# Patient Record
Sex: Female | Born: 1980 | Race: Black or African American | Hispanic: No | Marital: Married | Smoking: Current some day smoker
Health system: Southern US, Community
[De-identification: ages and names within clinical notes are randomized; demographics above are authoritative.]

## PROBLEM LIST (undated history)

## (undated) ENCOUNTER — Inpatient Hospital Stay (HOSPITAL_COMMUNITY): Payer: Self-pay

## (undated) DIAGNOSIS — J40 Bronchitis, not specified as acute or chronic: Secondary | ICD-10-CM

## (undated) DIAGNOSIS — J45909 Unspecified asthma, uncomplicated: Secondary | ICD-10-CM

## (undated) HISTORY — PX: NO PAST SURGERIES: SHX2092

---
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
## 2003-04-19 ENCOUNTER — Emergency Department (HOSPITAL_COMMUNITY): Admission: EM | Admit: 2003-04-19 | Discharge: 2003-04-19 | Payer: Self-pay | Admitting: Emergency Medicine

## 2003-09-22 ENCOUNTER — Emergency Department (HOSPITAL_COMMUNITY): Admission: EM | Admit: 2003-09-22 | Discharge: 2003-09-23 | Payer: Self-pay | Admitting: Emergency Medicine

## 2004-05-30 ENCOUNTER — Inpatient Hospital Stay (HOSPITAL_COMMUNITY): Admission: AD | Admit: 2004-05-30 | Discharge: 2004-05-30 | Payer: Self-pay | Admitting: *Deleted

## 2004-06-01 ENCOUNTER — Emergency Department (HOSPITAL_COMMUNITY): Admission: EM | Admit: 2004-06-01 | Discharge: 2004-06-01 | Payer: Self-pay | Admitting: Emergency Medicine

## 2004-09-29 ENCOUNTER — Emergency Department (HOSPITAL_COMMUNITY): Admission: EM | Admit: 2004-09-29 | Discharge: 2004-09-29 | Payer: Self-pay | Admitting: Emergency Medicine

## 2005-05-21 ENCOUNTER — Emergency Department (HOSPITAL_COMMUNITY): Admission: EM | Admit: 2005-05-21 | Discharge: 2005-05-22 | Payer: Self-pay | Admitting: Emergency Medicine

## 2006-07-29 ENCOUNTER — Inpatient Hospital Stay (HOSPITAL_COMMUNITY): Admission: AD | Admit: 2006-07-29 | Discharge: 2006-07-29 | Payer: Self-pay | Admitting: Obstetrics & Gynecology

## 2008-12-08 ENCOUNTER — Inpatient Hospital Stay (HOSPITAL_COMMUNITY): Admission: AD | Admit: 2008-12-08 | Discharge: 2008-12-08 | Payer: Self-pay | Admitting: Obstetrics & Gynecology

## 2009-12-06 ENCOUNTER — Inpatient Hospital Stay (HOSPITAL_COMMUNITY): Admission: AD | Admit: 2009-12-06 | Discharge: 2009-12-06 | Payer: Self-pay | Admitting: Family Medicine

## 2009-12-29 ENCOUNTER — Emergency Department (HOSPITAL_COMMUNITY)
Admission: EM | Admit: 2009-12-29 | Discharge: 2009-12-29 | Payer: Self-pay | Source: Home / Self Care | Admitting: Family Medicine

## 2010-01-01 ENCOUNTER — Emergency Department (HOSPITAL_COMMUNITY)
Admission: EM | Admit: 2010-01-01 | Discharge: 2010-01-02 | Payer: Self-pay | Source: Home / Self Care | Admitting: Emergency Medicine

## 2010-04-06 LAB — POCT PREGNANCY, URINE: Preg Test, Ur: NEGATIVE

## 2010-04-06 LAB — URINALYSIS, ROUTINE W REFLEX MICROSCOPIC
Glucose, UA: NEGATIVE mg/dL
Glucose, UA: NEGATIVE mg/dL
Hgb urine dipstick: NEGATIVE
Specific Gravity, Urine: 1.044 — ABNORMAL HIGH (ref 1.005–1.030)
Specific Gravity, Urine: >1.03 — ABNORMAL HIGH (ref 1.005–1.030)
Urobilinogen, UA: 0.2 mg/dL (ref 0.0–1.0)
pH: 6.5 (ref 5.0–8.0)

## 2010-04-06 LAB — WET PREP, GENITAL
Clue Cells Wet Prep HPF POC: NONE SEEN
Trich, Wet Prep: NONE SEEN

## 2010-04-06 LAB — GC/CHLAMYDIA PROBE AMP, GENITAL: GC Probe Amp, Genital: NEGATIVE

## 2010-04-06 LAB — URINE MICROSCOPIC-ADD ON

## 2010-04-06 LAB — CBC
MCV: 91.6 fL (ref 78.0–100.0)
Platelets: 276 10*3/uL (ref 150–400)
RBC: 4.29 MIL/uL (ref 3.87–5.11)
WBC: 7 10*3/uL (ref 4.0–10.5)

## 2010-04-06 LAB — POCT I-STAT, CHEM 8
Calcium, Ion: 1.12 mmol/L (ref 1.12–1.32)
Chloride: 101 mEq/L (ref 96–112)
Creatinine, Ser: 0.9 mg/dL (ref 0.4–1.2)
Glucose, Bld: 90 mg/dL (ref 70–99)
HCT: 44 % (ref 36.0–46.0)
Potassium: 3.9 mEq/L (ref 3.5–5.1)
TCO2: 27 mmol/L (ref 0–100)

## 2010-04-28 LAB — CBC
HCT: 41.6 % (ref 36.0–46.0)
Hemoglobin: 14 g/dL (ref 12.0–15.0)
RBC: 4.53 MIL/uL (ref 3.87–5.11)
WBC: 7.3 10*3/uL (ref 4.0–10.5)

## 2010-04-28 LAB — POCT PREGNANCY, URINE: Preg Test, Ur: POSITIVE

## 2010-04-28 LAB — GC/CHLAMYDIA PROBE AMP, GENITAL: Chlamydia, DNA Probe: NEGATIVE

## 2010-04-28 LAB — URINE MICROSCOPIC-ADD ON

## 2010-04-28 LAB — URINALYSIS, ROUTINE W REFLEX MICROSCOPIC
Glucose, UA: NEGATIVE mg/dL
Leukocytes, UA: NEGATIVE
Nitrite: NEGATIVE
Specific Gravity, Urine: 1.015 (ref 1.005–1.030)
pH: 7 (ref 5.0–8.0)

## 2010-04-28 LAB — ABO/RH: ABO/RH(D): O POS

## 2010-11-09 LAB — URINALYSIS, ROUTINE W REFLEX MICROSCOPIC
Glucose, UA: NEGATIVE
Ketones, ur: NEGATIVE
Nitrite: NEGATIVE
Protein, ur: NEGATIVE
pH: 7

## 2010-11-09 LAB — POCT PREGNANCY, URINE: Preg Test, Ur: NEGATIVE

## 2011-09-16 ENCOUNTER — Emergency Department (HOSPITAL_COMMUNITY)
Admission: EM | Admit: 2011-09-16 | Discharge: 2011-09-16 | Disposition: A | Discharge status: Home / Self Care | Payer: Self-pay | Attending: Emergency Medicine | Admitting: Emergency Medicine

## 2011-09-16 ENCOUNTER — Encounter (HOSPITAL_COMMUNITY): Payer: Self-pay | Admitting: Emergency Medicine

## 2011-09-16 DIAGNOSIS — F17200 Nicotine dependence, unspecified, uncomplicated: Secondary | ICD-10-CM | POA: Insufficient documentation

## 2011-09-16 DIAGNOSIS — J45909 Unspecified asthma, uncomplicated: Secondary | ICD-10-CM | POA: Insufficient documentation

## 2011-09-16 DIAGNOSIS — Z91038 Other insect allergy status: Secondary | ICD-10-CM | POA: Insufficient documentation

## 2011-09-16 DIAGNOSIS — K047 Periapical abscess without sinus: Secondary | ICD-10-CM | POA: Insufficient documentation

## 2011-09-16 DIAGNOSIS — K029 Dental caries, unspecified: Secondary | ICD-10-CM | POA: Insufficient documentation

## 2011-09-16 DIAGNOSIS — K0889 Other specified disorders of teeth and supporting structures: Secondary | ICD-10-CM

## 2011-09-16 HISTORY — DX: Unspecified asthma, uncomplicated: J45.909

## 2011-09-16 MED ORDER — OXYCODONE-ACETAMINOPHEN 5-325 MG PO TABS
2.0000 | ORAL_TABLET | Freq: Once | ORAL | Status: AC
  Administered 2011-09-16: 2 via ORAL
  Filled 2011-09-16: qty 2

## 2011-09-16 MED ORDER — CLINDAMYCIN HCL 150 MG PO CAPS: 300.0000 mg | ORAL_CAPSULE | Freq: Four times a day (QID) | ORAL | Status: AC

## 2011-09-16 NOTE — ED Provider Notes (Signed)
Medical screening examination/treatment/procedure(s) were performed by non-physician practitioner and as supervising physician I was immediately available for consultation/collaboration.   Hurman Horn, MD 09/16/11 2156

## 2011-09-16 NOTE — ED Notes (Signed)
Patient reports pain/swelling on right side of face x 1 week and abscess since yesterday. Patient denies fevers.  Patient states her dental insurance doesn't begin until the first of next month.

## 2011-09-16 NOTE — ED Provider Notes (Signed)
History     CSN: [id number]  Arrival date & time 09/16/11  0904   First MD Initiated Contact with Patient 09/16/11 [phone]      Chief Complaint  Patient presents with  . Dental Pain    (Consider location/radiation/quality/duration/timing/severity/associated sxs/prior treatment) Patient is a 31 y.o. female presenting with tooth pain. The history is provided by the patient and medical records.  Dental PainThe primary symptoms include headaches. Primary symptoms do not include fever or sore throat.  The headache is not associated with neck stiffness.   Additional symptoms do not include: trouble swallowing, drooling, ear pain and fatigue.   IYESHA SUCH is a 31 y.o. female presents to the emergency department complaining of dental pain.  The onset of the symptoms was  gradual starting 1 month ago.  The patient has associated headache and R sided neck pain.  The symptoms have been  persistent, gradually worsened. Eating, talking, movement of the jaw makes the symptoms worse and tylenol and ibuprofen makes symptoms better for a few hours.  The patient denies fever, chills, lightheadedness, trouble swallowing, trouble speaking.  Hx of trouble with those teeth, including dental caries and a known need for removal, but pt was waiting for dental insurance for treatment.  She states it began as just pain in the tooth and has progressed to swelling of the area.  She also endorses pain in her neck, swollen lymph nodes and pain in her jaw.  The patient has medical history significant for:  Past Medical History  Diagnosis Date  . Asthma      Past Medical History  Diagnosis Date  . Asthma     History reviewed. No pertinent past surgical history.  History reviewed. No pertinent family history.  History  Substance Use Topics  . Smoking status: Current Some Day Smoker  . Smokeless tobacco: Not on file  . Alcohol Use: No    OB History    Grav Para Term Preterm Abortions TAB SAB Ect Mult  Living                  Review of Systems  Constitutional: Negative for fever, diaphoresis and fatigue.  HENT: Positive for neck pain and dental problem. Negative for ear pain, congestion, sore throat, rhinorrhea, drooling, trouble swallowing, neck stiffness, voice change, sinus pressure and tinnitus.   Gastrointestinal: Negative for nausea and vomiting.  Neurological: Positive for headaches. Negative for facial asymmetry and light-headedness.  Hematological: Positive for adenopathy.    Allergies  Bee venom  Home Medications   Current Outpatient Rx  Name Route Sig Dispense Refill  . HYDROCODONE-ACETAMINOPHEN 5-500 MG PO TABS Oral Take 1 tablet by mouth every 6 (six) hours as needed. For pain.    . IBUPROFEN 200 MG PO TABS Oral Take 400 mg by mouth every 8 (eight) hours as needed. For pain.      BP 127/76  Pulse 91  Temp 99.2 F (37.3 C) (Oral)  Resp 18  Ht 4\' 11"  (1.499 m)  Wt 190 lb (86.183 kg)  BMI 38.38 kg/m2  SpO2 97%  LMP 09/06/2011  Physical Exam  Nursing note and vitals reviewed. Constitutional: She appears well-developed and well-nourished. No distress.  HENT:  Head: Normocephalic and atraumatic. No trismus in the jaw.  Right Ear: Tympanic membrane, external ear and ear canal normal.  Left Ear: Tympanic membrane, external ear and ear canal normal.  Nose: Nose normal.  Mouth/Throat: Oropharynx is clear and moist. No oral lesions. Abnormal dentition. Dental  abscesses and dental caries present. No uvula swelling or lacerations. No oropharyngeal exudate, posterior oropharyngeal edema, posterior oropharyngeal erythema or tonsillar abscesses.  Eyes: Conjunctivae are normal. No scleral icterus.  Neck: Normal range of motion. Neck supple.  Cardiovascular: Normal rate, regular rhythm and intact distal pulses.   Pulmonary/Chest: Effort normal and breath sounds normal. No respiratory distress. She has no wheezes.  Abdominal: Soft. Bowel sounds are normal. She exhibits no  mass. There is no tenderness. There is no rebound and no guarding.  Musculoskeletal: Normal range of motion. She exhibits no edema.  Neurological: She is alert.       Speech is clear and goal oriented Moves extremities without ataxia  Skin: Skin is warm and dry. She is not diaphoretic.  Psychiatric: She has a normal mood and affect.    ED Course  Procedures (including critical care time)  Labs Reviewed - No data to display No results found.   1. Pain, dental   2. Dental abscess   3. Dental caries       MDM  Tami Ribas presents with a dental pain and swelling.  She is a healthy young female, nontoxic, non-septic appearing.  On exam she has an abscess of one of her upper right molars associated caries.  She is afebrile and her vitals are within normal limits.  Her airway is patent, she is handling her secretions she is able to swallow without difficulty.  Pain radiates down into her neck but there is no evidence of deep tissue infection.  I will treat with by mouth clindamycin, pain meds and referred to dental. I have also discussed reasons to return immediately to the ER.  Patient states understanding.    1. Medications: Clindamycin, Norco 2. Treatment: Fluid hydration, take medications as prescribed, warm compress to the area, saltwater swish. 3. Follow Up: Dentistry as soon as possible.         Dahlia Client Vaudine Dutan, PA-C 09/16/11 1054

## 2011-09-16 NOTE — ED Notes (Signed)
Patient states she's been having dental pain on the upper right side for about a week- abscess noted on inside of upper right gum

## 2012-09-08 ENCOUNTER — Emergency Department (HOSPITAL_COMMUNITY)
Admission: EM | Admit: 2012-09-08 | Discharge: 2012-09-09 | Disposition: A | Discharge status: Home / Self Care | Payer: Self-pay | Attending: Emergency Medicine | Admitting: Emergency Medicine

## 2012-09-08 DIAGNOSIS — R0789 Other chest pain: Secondary | ICD-10-CM | POA: Insufficient documentation

## 2012-09-08 DIAGNOSIS — F17200 Nicotine dependence, unspecified, uncomplicated: Secondary | ICD-10-CM | POA: Insufficient documentation

## 2012-09-08 DIAGNOSIS — Z3202 Encounter for pregnancy test, result negative: Secondary | ICD-10-CM | POA: Insufficient documentation

## 2012-09-08 DIAGNOSIS — J45909 Unspecified asthma, uncomplicated: Secondary | ICD-10-CM | POA: Insufficient documentation

## 2012-09-08 LAB — BASIC METABOLIC PANEL
CO2: 29 mEq/L (ref 19–32)
Calcium: 9.3 mg/dL (ref 8.4–10.5)
Chloride: 102 mEq/L (ref 96–112)
Creatinine, Ser: 0.69 mg/dL (ref 0.50–1.10)
Glucose, Bld: 83 mg/dL (ref 70–99)
Potassium: 3.5 mEq/L (ref 3.5–5.1)
Sodium: 138 mEq/L (ref 135–145)

## 2012-09-08 LAB — CBC
MCHC: 33.2 g/dL (ref 30.0–36.0)
RDW: 12.9 % (ref 11.5–15.5)

## 2012-09-08 LAB — PRO B NATRIURETIC PEPTIDE: Pro B Natriuretic peptide (BNP): 20.6 pg/mL (ref 0–125)

## 2012-09-08 LAB — POCT I-STAT TROPONIN I

## 2012-09-08 NOTE — ED Notes (Signed)
PT. REPORTS RIGHT CHEST PAIN AND UPPER BACK PAIN WITH SOB WORSE WITH DEEP INSPIRATION AND LYING ONSET 2 WEEKS AGO. DENIES NAUSEA OR DIAPHORESIS .

## 2012-09-09 ENCOUNTER — Emergency Department (HOSPITAL_COMMUNITY): Payer: Self-pay

## 2012-09-09 NOTE — ED Notes (Signed)
MD at bedside. 

## 2012-09-09 NOTE — ED Provider Notes (Signed)
CSN: [id number]     Arrival date & time 09/08/12  2154 History     First MD Initiated Contact with Patient 09/09/12 0016     Chief Complaint  Patient presents with  . Chest Pain   (Consider location/radiation/quality/duration/timing/severity/associated sxs/prior Treatment) HPI Comments: Patient is a 32 year old female with no significant past medical history. She presents this evening with complaints of intermittent chest pains that she tells me have been occurring for years. This seems to come and go without any precipitating factors and is usually relieved with time and rest. She describes the discomfort as sharp in nature and is worse with respiration. She denies any shortness of breath, fever, cough. There are no exertional symptoms and there is no radiation to the arm or jaw, diaphoresis, or nausea. She has no cardiac risk factors with the exception of occasional cigarette smoking.  Patient is a 32 y.o. female presenting with chest pain. The history is provided by the patient.  Chest Pain Chest pain location: anterior chest wall. Pain quality: sharp   Pain radiates to:  Does not radiate Pain radiates to the back: no   Pain severity:  Moderate Timing:  Intermittent Progression:  Worsening Chronicity:  Recurrent Context: not breathing   Relieved by:  Nothing Ineffective treatments:  None tried   Past Medical History  Diagnosis Date  . Asthma    No past surgical history on file. No family history on file. History  Substance Use Topics  . Smoking status: Current Some Day Smoker  . Smokeless tobacco: Not on file  . Alcohol Use: No   OB History   Grav Para Term Preterm Abortions TAB SAB Ect Mult Living                 Review of Systems  Cardiovascular: Positive for chest pain.  All other systems reviewed and are negative.    Allergies  Bee venom  Home Medications  No current outpatient prescriptions on file. BP 132/92  Pulse 80  Temp(Src) 98.1 F (36.7 C)  (Oral)  Resp 16  SpO2 99%  LMP 08/24/2012 Physical Exam  Nursing note and vitals reviewed. Constitutional: She is oriented to person, place, and time. She appears well-developed and well-nourished. No distress.  HENT:  Head: Normocephalic and atraumatic.  Neck: Normal range of motion. Neck supple.  Cardiovascular: Normal rate and regular rhythm.  Exam reveals no gallop and no friction rub.   No murmur heard. Pulmonary/Chest: Effort normal and breath sounds normal. No respiratory distress. She has no wheezes.  Abdominal: Soft. Bowel sounds are normal. She exhibits no distension. There is no tenderness.  Musculoskeletal: Normal range of motion.  Neurological: She is alert and oriented to person, place, and time.  Skin: Skin is warm and dry. She is not diaphoretic.    ED Course   Procedures (including critical care time)  Labs Reviewed  CBC  BASIC METABOLIC PANEL  PRO B NATRIURETIC PEPTIDE  POCT PREGNANCY, URINE  POCT I-STAT TROPONIN I   No results found. No diagnosis found.   Date: 09/09/2012  Rate: 90  Rhythm: normal sinus rhythm  QRS Axis: normal  Intervals: normal  ST/T Wave abnormalities: normal  Conduction Disutrbances:none  Narrative Interpretation:   Old EKG Reviewed: none available    MDM  Patient presents here with complaint of chest pain that has been ongoing for several years. It worsened this evening. The workup reveals normal EKG and negative troponin. The remainder of the laboratory studies and chest x-ray are  unremarkable as well. I suspect a musculoskeletal etiology as nothing emergent has been found in the workup. She has no cardiac risk factors and her symptoms are atypical for cardiac pain. I do not feel as though any further workup is indicated into this. She will be discharged to home with NSAIDs, rest, and time.   Geoffery Lyons, MD 09/09/12 [phone]

## 2012-09-09 NOTE — ED Notes (Signed)
Delo ,MD at bedside 

## 2012-10-26 ENCOUNTER — Emergency Department (HOSPITAL_COMMUNITY): Payer: Self-pay

## 2012-10-26 ENCOUNTER — Emergency Department (HOSPITAL_COMMUNITY)
Admission: EM | Admit: 2012-10-26 | Discharge: 2012-10-26 | Disposition: A | Discharge status: Home / Self Care | Payer: Self-pay | Attending: Emergency Medicine | Admitting: Emergency Medicine

## 2012-10-26 ENCOUNTER — Encounter (HOSPITAL_COMMUNITY): Payer: Self-pay | Admitting: Emergency Medicine

## 2012-10-26 DIAGNOSIS — J069 Acute upper respiratory infection, unspecified: Secondary | ICD-10-CM | POA: Insufficient documentation

## 2012-10-26 DIAGNOSIS — R42 Dizziness and giddiness: Secondary | ICD-10-CM | POA: Insufficient documentation

## 2012-10-26 DIAGNOSIS — F17200 Nicotine dependence, unspecified, uncomplicated: Secondary | ICD-10-CM | POA: Insufficient documentation

## 2012-10-26 DIAGNOSIS — E860 Dehydration: Secondary | ICD-10-CM | POA: Insufficient documentation

## 2012-10-26 DIAGNOSIS — J029 Acute pharyngitis, unspecified: Secondary | ICD-10-CM | POA: Insufficient documentation

## 2012-10-26 DIAGNOSIS — R05 Cough: Secondary | ICD-10-CM

## 2012-10-26 DIAGNOSIS — J45909 Unspecified asthma, uncomplicated: Secondary | ICD-10-CM | POA: Insufficient documentation

## 2012-10-26 LAB — RAPID STREP SCREEN (MED CTR MEBANE ONLY): Streptococcus, Group A Screen (Direct): NEGATIVE

## 2012-10-26 MED ORDER — ACETAMINOPHEN-CODEINE #3 300-30 MG PO TABS: 1.0000 | ORAL_TABLET | Freq: Four times a day (QID) | ORAL | Status: DC | PRN

## 2012-10-26 MED ORDER — KETOROLAC TROMETHAMINE 60 MG/2ML IM SOLN
60.0000 mg | Freq: Once | INTRAMUSCULAR | Status: AC
  Administered 2012-10-26: 60 mg via INTRAMUSCULAR
  Filled 2012-10-26: qty 2

## 2012-10-26 NOTE — Progress Notes (Signed)
[facility] provided pt with a list of primary care resources.  °

## 2012-10-26 NOTE — ED Notes (Signed)
Pt reports that last night she developed dizziness, sore throat, nausea, and a headache. Pt reports that reports that the sore throat is worse while swallowing. Pt is A/O x4 and in NAD. Pt talking in full sentences and vital signs are WDL.

## 2012-10-26 NOTE — ED Provider Notes (Signed)
CSN: [id number]     Arrival date & time 10/26/12  1556 History   First MD Initiated Contact with Patient 10/26/12 1612     Chief Complaint  Patient presents with  . Dizziness  . Sore Throat   (Consider location/radiation/quality/duration/timing/severity/associated sxs/prior Treatment) HPI Comments: Patient is a 32 year old female with history of asthma who presents today with sore throat since yesterday. Her sore throat is worse with swallowing.  It is not aggravated with talking. She took ibuprofen yesterday which improved her pain. She notes an associated headache. The headache is throbbing. She struggles with migraines and has an outpatient MRI scheduled. She also notes associated lightheadedness. Her lightheadedness is worse with changing positions such as going from sitting to standing. She has been unable to eat and drink as much his normal due to the pain in her throat. She notes that today she had a cough where she brought up mucus and a small amount of blood one time. This prompted her to come to the emergency room. She has had a mild cough that has been dry throughout the day today. No nausea, vomiting, abdominal pain.  Patient is a 32 y.o. female presenting with pharyngitis. The history is provided by the patient. No language interpreter was used.  Sore Throat Associated symptoms include coughing, headaches and a sore throat. Pertinent negatives include no abdominal pain, chills, congestion, fever, nausea or vomiting.    Past Medical History  Diagnosis Date  . Asthma    History reviewed. No pertinent past surgical history. No family history on file. History  Substance Use Topics  . Smoking status: Current Some Day Smoker  . Smokeless tobacco: Not on file  . Alcohol Use: No   OB History   Grav Para Term Preterm Abortions TAB SAB Ect Mult Living                 Review of Systems  Constitutional: Negative for fever and chills.  HENT: Positive for sore throat. Negative for  ear pain, congestion, rhinorrhea, drooling and sinus pressure.   Respiratory: Positive for cough. Negative for shortness of breath.   Gastrointestinal: Negative for nausea, vomiting and abdominal pain.  Neurological: Positive for light-headedness and headaches.  All other systems reviewed and are negative.    Allergies  Bee venom  Home Medications   Current Outpatient Rx  Name  Route  Sig  Dispense  Refill  . ibuprofen (ADVIL,MOTRIN) 200 MG tablet   Oral   Take 400 mg by mouth every 6 (six) hours as needed for pain (pain).          BP 143/93  Pulse 88  Temp(Src) 99 F (37.2 C) (Oral)  Resp 20  SpO2 100%  LMP 10/12/2012 Physical Exam  Nursing note and vitals reviewed. Constitutional: She is oriented to person, place, and time. She appears well-developed and well-nourished. No distress.  HENT:  Head: Normocephalic and atraumatic.  Right Ear: External ear normal.  Left Ear: External ear normal.  Nose: Nose normal.  Mouth/Throat: Uvula is midline and oropharynx is clear and moist. No trismus in the jaw.  No trismus, submental edema, tongue elevation. No enlargement of her tonsils bilaterally. No exudate seen.  Eyes: Conjunctivae are normal.  Neck: Normal range of motion.  Cardiovascular: Normal rate, regular rhythm and normal heart sounds.   Pulmonary/Chest: Effort normal and breath sounds normal. No stridor. No respiratory distress. She has no wheezes. She has no rales.  Abdominal: Soft. She exhibits no distension.  Musculoskeletal: Normal  range of motion.  Lymphadenopathy:    She has no cervical adenopathy.  Neurological: She is alert and oriented to person, place, and time. She has normal strength.  Skin: Skin is warm and dry. She is not diaphoretic. No erythema.  Psychiatric: She has a normal mood and affect. Her behavior is normal.    ED Course  Procedures (including critical care time) Labs Review Labs Reviewed  RAPID STREP SCREEN  CULTURE, GROUP A STREP    Imaging Review Dg Chest 2 View  10/26/2012   CLINICAL DATA:  Cough and shortness of breath  EXAM: CHEST  2 VIEW  COMPARISON:  September 09, 2012  FINDINGS: Lungs are clear. Heart size and pulmonary vascularity are normal. No adenopathy. No bone lesions.  IMPRESSION: No abnormality noted.   Electronically Signed   By: Bretta Bang M.D.   On: 10/26/2012 16:48    MDM   1. Viral pharyngitis   2. Mild dehydration   3. Cough   4. URI (upper respiratory infection)    Pt CXR negative for acute infiltrate. Patients symptoms are consistent with URI, likely viral etiology. Discussed that antibiotics are not indicated for viral infections. Pt will be discharged with symptomatic treatment.  Verbalizes understanding and is agreeable with plan. Pt is hemodynamically stable & in NAD prior to dc.  Pt afebrile without tonsillar exudate, negative strep. Presents with no cervical lymphadenopathy. Patient has dysphagia; diagnosis of viral pharyngitis. No abx indicated. DC w symptomatic tx for pain  Pt does not appear dehydrated, but did discuss importance of water rehydration. Presentation non concerning for PTA or infxn spread to soft tissue. No trismus or uvula deviation. Specific return precautions discussed. Pt able to drink water in ED without difficulty with intact air way. Recommended PCP follow up.     Mora Bellman, PA-C 10/26/12 2221

## 2012-10-27 NOTE — ED Provider Notes (Signed)
Medical screening examination/treatment/procedure(s) were performed by non-physician practitioner and as supervising physician I was immediately available for consultation/collaboration.  Shelbylynn Walczyk M Zhaire Locker, MD 10/27/12 1322 

## 2012-10-28 LAB — CULTURE, GROUP A STREP

## 2012-11-27 ENCOUNTER — Encounter (HOSPITAL_COMMUNITY): Payer: Self-pay | Admitting: Emergency Medicine

## 2012-11-27 ENCOUNTER — Emergency Department (HOSPITAL_COMMUNITY)
Admission: EM | Admit: 2012-11-27 | Discharge: 2012-11-27 | Disposition: A | Discharge status: Home / Self Care | Payer: Self-pay | Attending: Emergency Medicine | Admitting: Emergency Medicine

## 2012-11-27 DIAGNOSIS — N898 Other specified noninflammatory disorders of vagina: Secondary | ICD-10-CM | POA: Insufficient documentation

## 2012-11-27 DIAGNOSIS — N390 Urinary tract infection, site not specified: Secondary | ICD-10-CM | POA: Insufficient documentation

## 2012-11-27 DIAGNOSIS — F17200 Nicotine dependence, unspecified, uncomplicated: Secondary | ICD-10-CM | POA: Insufficient documentation

## 2012-11-27 DIAGNOSIS — Z3202 Encounter for pregnancy test, result negative: Secondary | ICD-10-CM | POA: Insufficient documentation

## 2012-11-27 DIAGNOSIS — Z792 Long term (current) use of antibiotics: Secondary | ICD-10-CM | POA: Insufficient documentation

## 2012-11-27 DIAGNOSIS — J45909 Unspecified asthma, uncomplicated: Secondary | ICD-10-CM | POA: Insufficient documentation

## 2012-11-27 LAB — URINALYSIS, ROUTINE W REFLEX MICROSCOPIC
Glucose, UA: NEGATIVE mg/dL
Specific Gravity, Urine: 1.021 (ref 1.005–1.030)
pH: 6 (ref 5.0–8.0)

## 2012-11-27 LAB — COMPREHENSIVE METABOLIC PANEL
Albumin: 4 g/dL (ref 3.5–5.2)
BUN: 13 mg/dL (ref 6–23)
Creatinine, Ser: 0.73 mg/dL (ref 0.50–1.10)
Total Protein: 8.5 g/dL — ABNORMAL HIGH (ref 6.0–8.3)

## 2012-11-27 LAB — URINE MICROSCOPIC-ADD ON

## 2012-11-27 LAB — POCT PREGNANCY, URINE: Preg Test, Ur: NEGATIVE

## 2012-11-27 LAB — CBC WITH DIFFERENTIAL/PLATELET
Basophils Absolute: 0 10*3/uL (ref 0.0–0.1)
Basophils Relative: 0 % (ref 0–1)
Eosinophils Absolute: 0.1 10*3/uL (ref 0.0–0.7)
HCT: 37.2 % (ref 36.0–46.0)
Hemoglobin: 12.9 g/dL (ref 12.0–15.0)
MCH: 30.3 pg (ref 26.0–34.0)
MCHC: 34.7 g/dL (ref 30.0–36.0)
Monocytes Absolute: 0.5 10*3/uL (ref 0.1–1.0)
Monocytes Relative: 9 % (ref 3–12)
Neutrophils Relative %: 43 % (ref 43–77)
RDW: 12.6 % (ref 11.5–15.5)

## 2012-11-27 LAB — WET PREP, GENITAL

## 2012-11-27 MED ORDER — CEPHALEXIN 500 MG PO CAPS: 500.0000 mg | ORAL_CAPSULE | Freq: Four times a day (QID) | ORAL | Status: DC

## 2012-11-27 NOTE — ED Provider Notes (Signed)
CSN: [id number]     Arrival date & time 11/27/12  1904 History   First MD Initiated Contact with Patient 11/27/12 2011     Chief Complaint  Patient presents with  . Abdominal Pain  . Vaginal Bleeding   (Consider location/radiation/quality/duration/timing/severity/associated sxs/prior Treatment) HPI Here for vaginal spotting, intermittent lower pelvis pain. Onset was 3 days ago, intermittent, pain improved.  The pain is mild. Modifying factors: none.  Associated symptoms: urinary frequncy.  Recent medical care: none.   Past Medical History  Diagnosis Date  . Asthma    History reviewed. No pertinent past surgical history. No family history on file. History  Substance Use Topics  . Smoking status: Current Some Day Smoker  . Smokeless tobacco: Not on file  . Alcohol Use: No   OB History   Grav Para Term Preterm Abortions TAB SAB Ect Mult Living                 Review of Systems Constitutional: Negative for fever.  Eyes: Negative for vision loss.  ENT: Negative for difficulty swallowing.  Cardiovascular: Negative for chest pain. Respiratory: Negative for respiratory distress.  Gastrointestinal:  Negative for vomiting.  Genitourinary: Negative for inability to void.  Musculoskeletal: Negative for gait problem.  Integumentary: Negative for rash.  Neurological: Negative for new focal weakness.     Allergies  Bee venom  Home Medications   Current Outpatient Rx  Name  Route  Sig  Dispense  Refill  . ibuprofen (ADVIL,MOTRIN) 200 MG tablet   Oral   Take 200 mg by mouth every 6 (six) hours as needed for mild pain.          . cephALEXin (KEFLEX) 500 MG capsule   Oral   Take 1 capsule (500 mg total) by mouth 4 (four) times daily.   20 capsule   0    BP 111/57  Pulse 70  Temp(Src) 98.1 F (36.7 C) (Oral)  Resp 16  Ht 4\' 11"  (1.499 m)  Wt 198 lb (89.812 kg)  BMI 39.97 kg/m2  SpO2 99%  LMP 11/07/2012 Physical Exam Nursing note and vitals  reviewed.  Constitutional: Pt is alert and appears stated age. Eyes: No injection, no scleral icterus. HENT: Atraumatic, airway open without erythema or exudate.  Respiratory: No respiratory distress. Equal breathing bilaterally. Cardiovascular: Normal rate. Extremities warm and well perfused.  Abdomen: Soft, non-tender. MSK: Extremities are atraumatic without deformity. Skin: No rash, no wounds.   Neuro: No motor nor sensory deficit. GU: normal external vagina. No bleeding. Closed cervix. Scant discharge. No CMT or uterine tenderness.     ED Course  Procedures (including critical care time) Labs Review Labs Reviewed  WET PREP, GENITAL - Abnormal; Notable for the following:    Clue Cells Wet Prep HPF POC FEW (*)    All other components within normal limits  CBC WITH DIFFERENTIAL - Abnormal; Notable for the following:    Lymphocytes Relative 47 (*)    All other components within normal limits  COMPREHENSIVE METABOLIC PANEL - Abnormal; Notable for the following:    Total Protein 8.5 (*)    All other components within normal limits  URINALYSIS, ROUTINE W REFLEX MICROSCOPIC - Abnormal; Notable for the following:    APPearance CLOUDY (*)    Hgb urine dipstick LARGE (*)    Protein, ur 30 (*)    Nitrite POSITIVE (*)    Leukocytes, UA LARGE (*)    All other components within normal limits  URINE MICROSCOPIC-ADD ON -  Abnormal; Notable for the following:    Bacteria, UA FEW (*)    All other components within normal limits  URINE CULTURE  GC/CHLAMYDIA PROBE AMP  POCT PREGNANCY, URINE   Imaging Review No results found.  EKG Interpretation   None       MDM   1. UTI (urinary tract infection)    32 y.o. female w/ PMHx of asthma presents w/ vaginal spotting, urinary frequency, lower abdominal tenderness. Pt looks well, normal vitals. Benign abdominal exam. No vaginal bleeding. UA c/w infection. Presentation c/w cystitis. Plan to treat with keflex. Counseling provided regarding  diagnosis, treatment plan, follow up recommendations, and return precautions. Questions answered.       I independently viewed, interpreted, and used in my medical decision making all ordered lab and imaging tests. Medical Decision Making discussed with ED attending Dr. Freida Busman.      Charm Barges, MD 11/28/12 [phone]

## 2012-11-27 NOTE — ED Notes (Signed)
Pelvic Cart at bedside 

## 2012-11-27 NOTE — ED Notes (Signed)
Pt. Reports vaginal bleeding and intermittent low abdominal pain / low back pain onset Last Saturday . Denies hematuria or dysuria . No nausea or vomitting .

## 2012-11-27 NOTE — ED Notes (Signed)
Pt comfortable with d/c and f/u instructions. Prescriptions x1 

## 2012-11-28 ENCOUNTER — Inpatient Hospital Stay (HOSPITAL_COMMUNITY)
Admission: AD | Admit: 2012-11-28 | Discharge: 2012-11-28 | Disposition: A | Discharge status: Home / Self Care | Payer: Self-pay | Source: Ambulatory Visit | Attending: Obstetrics & Gynecology | Admitting: Obstetrics & Gynecology

## 2012-11-28 ENCOUNTER — Encounter (HOSPITAL_COMMUNITY): Payer: Self-pay

## 2012-11-28 DIAGNOSIS — R109 Unspecified abdominal pain: Secondary | ICD-10-CM | POA: Insufficient documentation

## 2012-11-28 DIAGNOSIS — R300 Dysuria: Secondary | ICD-10-CM | POA: Insufficient documentation

## 2012-11-28 DIAGNOSIS — N390 Urinary tract infection, site not specified: Secondary | ICD-10-CM

## 2012-11-28 LAB — GC/CHLAMYDIA PROBE AMP: CT Probe RNA: NEGATIVE

## 2012-11-28 MED ORDER — PHENAZOPYRIDINE HCL 100 MG PO TABS
200.0000 mg | ORAL_TABLET | Freq: Once | ORAL | Status: AC
  Administered 2012-11-28: 200 mg via ORAL
  Filled 2012-11-28: qty 2

## 2012-11-28 NOTE — MAU Note (Signed)
Evaluated for UTI & vaginal bleeding at [facility] yesterday. Hasn't started taking keflex prescribed, picked up rx just before coming to MAU. Here tonight for pain with urination & vaginal bleeding. States was expecting period on the 1st or 2nd of this month but had not started. Negative UPT at [facility]. No birth control.

## 2012-11-28 NOTE — MAU Provider Note (Signed)
  History     CSN: [id number]  Arrival date and time: 11/28/12 2128   First Provider Initiated Contact with Patient 11/28/12 2308      Chief Complaint  Patient presents with  . Abdominal Pain  . Urinary Tract Infection   HPI Ms Olivia Giles is a 32yo [id number] who presents for eval of dysuria and hematuria. She was dx at [facility] yesterday with a UTI- did not start on medication yet but picked up Keflex rx on the way in to MAU this evening.  Is concerned because these are not the symptoms she is expecting with a UTI. Denies fever or back pain. Denies vaginal bldg, but notices sm amt of blood in the toilet after voiding and also with wiping post-void.   OB History   Grav Para Term Preterm Abortions TAB SAB Ect Mult Living   1 0   1  1         Past Medical History  Diagnosis Date  . Asthma     History reviewed. No pertinent past surgical history.  History reviewed. No pertinent family history.  History  Substance Use Topics  . Smoking status: Current Some Day Smoker  . Smokeless tobacco: Not on file  . Alcohol Use: No    Allergies:  Allergies  Allergen Reactions  . Bee Venom Other (See Comments)    Swelling at site of sting     Prescriptions prior to admission  Medication Sig Dispense Refill  . ibuprofen (ADVIL,MOTRIN) 200 MG tablet Take 200 mg by mouth every 6 (six) hours as needed for mild pain.       . cephALEXin (KEFLEX) 500 MG capsule Take 1 capsule (500 mg total) by mouth 4 (four) times daily.  20 capsule  0    ROS Physical Exam   Blood pressure 122/72, pulse 95, temperature 98.8 F (37.1 C), temperature source Oral, resp. rate 16, height 4\' 11"  (1.499 m), weight 72.303 kg (159 lb 6.4 oz), last menstrual period 11/07/2012, SpO2 99.00%.  Physical Exam  Constitutional: She is oriented to person, place, and time. She appears well-developed.  HENT:  Head: Normocephalic.  Neck: Normal range of motion.  Cardiovascular: Normal rate.   Respiratory: Effort normal.  GI:  Soft.  Genitourinary:  Neg CVAT bilat  Musculoskeletal: Normal range of motion.  Neurological: She is alert and oriented to person, place, and time.  Skin: Skin is warm and dry.  Psychiatric: She has a normal mood and affect. Her behavior is normal. Thought content normal.    MAU Course  Procedures    Assessment and Plan  UTI- has not began treatment  Given Pyridium 200mg  while in MAU (declines rx for home use) Has Keflex in the car and will take first dose as soon as she is discharged Instructed to complete all of Keflex as directed Increase fluid intake Given warning s/s of pyelo and instructed when to return  SHAW, [facility] 11/28/2012, 11:20 PM

## 2012-11-29 NOTE — ED Provider Notes (Signed)
I saw and evaluated the patient, reviewed the resident's note and I agree with the findings and plan.  Kingsten Enfield T Alen Matheson, MD 11/29/12 1921 

## 2012-11-30 LAB — URINE CULTURE: Colony Count: >=100000

## 2013-06-12 ENCOUNTER — Emergency Department (HOSPITAL_COMMUNITY)
Admission: EM | Admit: 2013-06-12 | Discharge: 2013-06-12 | Discharge status: Against Medical Advice | Payer: No Typology Code available for payment source | Attending: Emergency Medicine | Admitting: Emergency Medicine

## 2013-06-12 ENCOUNTER — Encounter (HOSPITAL_COMMUNITY): Payer: Self-pay | Admitting: Emergency Medicine

## 2013-06-12 DIAGNOSIS — N949 Unspecified condition associated with female genital organs and menstrual cycle: Secondary | ICD-10-CM | POA: Insufficient documentation

## 2013-06-12 DIAGNOSIS — L293 Anogenital pruritus, unspecified: Secondary | ICD-10-CM | POA: Insufficient documentation

## 2013-06-12 DIAGNOSIS — J45909 Unspecified asthma, uncomplicated: Secondary | ICD-10-CM | POA: Insufficient documentation

## 2013-06-12 DIAGNOSIS — F17200 Nicotine dependence, unspecified, uncomplicated: Secondary | ICD-10-CM | POA: Insufficient documentation

## 2013-06-12 DIAGNOSIS — Z202 Contact with and (suspected) exposure to infections with a predominantly sexual mode of transmission: Secondary | ICD-10-CM | POA: Insufficient documentation

## 2013-06-12 DIAGNOSIS — Z3202 Encounter for pregnancy test, result negative: Secondary | ICD-10-CM | POA: Insufficient documentation

## 2013-06-12 DIAGNOSIS — R300 Dysuria: Secondary | ICD-10-CM | POA: Insufficient documentation

## 2013-06-12 LAB — PREGNANCY, URINE: Preg Test, Ur: NEGATIVE

## 2013-06-12 LAB — URINALYSIS, ROUTINE W REFLEX MICROSCOPIC
BILIRUBIN URINE: NEGATIVE
Glucose, UA: NEGATIVE mg/dL
Hgb urine dipstick: NEGATIVE
KETONES UR: NEGATIVE mg/dL
NITRITE: NEGATIVE
Protein, ur: NEGATIVE mg/dL
Specific Gravity, Urine: 1.027 (ref 1.005–1.030)
Urobilinogen, UA: 0.2 mg/dL (ref 0.0–1.0)
pH: 6 (ref 5.0–8.0)

## 2013-06-12 LAB — URINE MICROSCOPIC-ADD ON

## 2013-06-12 NOTE — ED Notes (Signed)
Call x2 with no answer 

## 2013-06-12 NOTE — ED Notes (Signed)
Call x1 for bed with no response

## 2013-06-12 NOTE — ED Notes (Addendum)
Pt presents with vaginal discharge x4 days, pt states she just found out her ex boyfriend was sleeping around. Pt reports she was experiencing burning with urination at first that has now subsided but reports she has intermittent vaginal itching, pt also reports vaginal odor

## 2013-07-22 ENCOUNTER — Emergency Department (HOSPITAL_COMMUNITY)
Admission: EM | Admit: 2013-07-22 | Discharge: 2013-07-22 | Disposition: A | Discharge status: Home / Self Care | Payer: No Typology Code available for payment source | Attending: Emergency Medicine | Admitting: Emergency Medicine

## 2013-07-22 ENCOUNTER — Encounter (HOSPITAL_COMMUNITY): Payer: Self-pay | Admitting: Emergency Medicine

## 2013-07-22 DIAGNOSIS — N390 Urinary tract infection, site not specified: Secondary | ICD-10-CM | POA: Insufficient documentation

## 2013-07-22 DIAGNOSIS — J45909 Unspecified asthma, uncomplicated: Secondary | ICD-10-CM | POA: Insufficient documentation

## 2013-07-22 DIAGNOSIS — Z792 Long term (current) use of antibiotics: Secondary | ICD-10-CM | POA: Insufficient documentation

## 2013-07-22 DIAGNOSIS — Z3202 Encounter for pregnancy test, result negative: Secondary | ICD-10-CM | POA: Insufficient documentation

## 2013-07-22 DIAGNOSIS — N730 Acute parametritis and pelvic cellulitis: Secondary | ICD-10-CM | POA: Insufficient documentation

## 2013-07-22 DIAGNOSIS — F17200 Nicotine dependence, unspecified, uncomplicated: Secondary | ICD-10-CM | POA: Insufficient documentation

## 2013-07-22 DIAGNOSIS — A5901 Trichomonal vulvovaginitis: Secondary | ICD-10-CM | POA: Insufficient documentation

## 2013-07-22 LAB — URINE MICROSCOPIC-ADD ON

## 2013-07-22 LAB — RPR

## 2013-07-22 LAB — WET PREP, GENITAL
CLUE CELLS WET PREP: NONE SEEN
Yeast Wet Prep HPF POC: NONE SEEN

## 2013-07-22 LAB — URINALYSIS, ROUTINE W REFLEX MICROSCOPIC
Bilirubin Urine: NEGATIVE
GLUCOSE, UA: NEGATIVE mg/dL
Ketones, ur: NEGATIVE mg/dL
Nitrite: NEGATIVE
Protein, ur: NEGATIVE mg/dL
Specific Gravity, Urine: 1.011 (ref 1.005–1.030)
UROBILINOGEN UA: 0.2 mg/dL (ref 0.0–1.0)
pH: 6.5 (ref 5.0–8.0)

## 2013-07-22 LAB — PREGNANCY, URINE: PREG TEST UR: NEGATIVE

## 2013-07-22 MED ORDER — LIDOCAINE HCL (PF) 1 % IJ SOLN
INTRAMUSCULAR | Status: AC
  Administered 2013-07-22: 19:00:00
  Filled 2013-07-22: qty 5

## 2013-07-22 MED ORDER — CEPHALEXIN 500 MG PO CAPS: 500.0000 mg | ORAL_CAPSULE | Freq: Four times a day (QID) | ORAL | Status: DC

## 2013-07-22 MED ORDER — METRONIDAZOLE 500 MG PO TABS: 500.0000 mg | ORAL_TABLET | Freq: Two times a day (BID) | ORAL | Status: DC

## 2013-07-22 MED ORDER — DOXYCYCLINE HYCLATE 100 MG PO CAPS: 100.0000 mg | ORAL_CAPSULE | Freq: Two times a day (BID) | ORAL | Status: DC

## 2013-07-22 MED ORDER — AZITHROMYCIN 1 G PO PACK
1.0000 g | PACK | Freq: Once | ORAL | Status: AC
  Administered 2013-07-22: 1 g via ORAL
  Filled 2013-07-22: qty 1

## 2013-07-22 MED ORDER — METRONIDAZOLE 500 MG PO TABS
2000.0000 mg | ORAL_TABLET | Freq: Once | ORAL | Status: AC
Start: 2013-07-22 — End: 2013-07-22
  Administered 2013-07-22: 2000 mg via ORAL
  Filled 2013-07-22: qty 4

## 2013-07-22 MED ORDER — LIDOCAINE HCL (PF) 1 % IJ SOLN
0.9000 mL | Freq: Once | INTRAMUSCULAR | Status: AC
  Administered 2013-07-22: 0.9 mL

## 2013-07-22 MED ORDER — PROMETHAZINE HCL 25 MG PO TABS: 25.0000 mg | ORAL_TABLET | Freq: Four times a day (QID) | ORAL | Status: DC | PRN

## 2013-07-22 MED ORDER — CEFTRIAXONE SODIUM 250 MG IJ SOLR
250.0000 mg | Freq: Once | INTRAMUSCULAR | Status: AC
Start: 2013-07-22 — End: 2013-07-22
  Administered 2013-07-22: 250 mg via INTRAMUSCULAR
  Filled 2013-07-22: qty 250

## 2013-07-22 NOTE — ED Provider Notes (Signed)
CSN: [id number]     Arrival date & time 07/22/13  1446 History   First MD Initiated Contact with Patient 07/22/13 1714     Chief Complaint  Patient presents with  . Abdominal Pain     (Consider location/radiation/quality/duration/timing/severity/associated sxs/prior Treatment) HPI Comments: Patient is a 33 year old female with history of asthma who presents today with vaginal discharge. She reports that last month she found out that her boyfriend had been cheating on her. She attempted to be evaluated when this all began around 5/20, wait times were too long and she was unable to be seen due to her ride leaving. Since that time she has been having gradually worsening suprapubic pain, vaginal discharge. She has associated dysuria and urinary urgency. She denies any nausea, vomiting, fever, chills. She has not taken anything to improve her pain. She is history of prior PID.  Patient is a 33 y.o. female presenting with abdominal pain. The history is provided by the patient. No language interpreter was used.  Abdominal Pain Associated symptoms: dysuria and vaginal discharge   Associated symptoms: no chest pain, no chills, no diarrhea, no fever, no nausea, no shortness of breath and no vomiting     Past Medical History  Diagnosis Date  . Asthma    History reviewed. No pertinent past surgical history. History reviewed. No pertinent family history. History  Substance Use Topics  . Smoking status: Current Some Day Smoker  . Smokeless tobacco: Not on file  . Alcohol Use: No   OB History   Grav Para Term Preterm Abortions TAB SAB Ect Mult Living   1 0   1  1        Review of Systems  Constitutional: Negative for fever and chills.  Respiratory: Negative for shortness of breath.   Cardiovascular: Negative for chest pain.  Gastrointestinal: Positive for abdominal pain. Negative for nausea, vomiting and diarrhea.  Genitourinary: Positive for dysuria, urgency, frequency, vaginal discharge  and pelvic pain.  All other systems reviewed and are negative.     Allergies  Bee venom  Home Medications   Prior to Admission medications   Medication Sig Start Date End Date Taking? Authorizing Provider  ibuprofen (ADVIL,MOTRIN) 200 MG tablet Take 200-400 mg by mouth every 6 (six) hours as needed for mild pain.    Yes Historical Provider, MD  cephALEXin (KEFLEX) 500 MG capsule Take 1 capsule (500 mg total) by mouth 4 (four) times daily. 07/22/13   Mora BellmanHannah S Merrell, PA-C  doxycycline (VIBRAMYCIN) 100 MG capsule Take 1 capsule (100 mg total) by mouth 2 (two) times daily. 07/22/13   Mora BellmanHannah S Merrell, PA-C  metroNIDAZOLE (FLAGYL) 500 MG tablet Take 1 tablet (500 mg total) by mouth 2 (two) times daily. 07/22/13   Mora BellmanHannah S Merrell, PA-C  promethazine (PHENERGAN) 25 MG tablet Take 1 tablet (25 mg total) by mouth every 6 (six) hours as needed for nausea or vomiting. 07/22/13   Mora BellmanHannah S Merrell, PA-C   BP 130/84  Pulse 92  Temp(Src) 98.3 F (36.8 C) (Oral)  Resp 18  Wt 148 lb (67.132 kg)  SpO2 100%  LMP 07/14/2013 Physical Exam  Nursing note and vitals reviewed. Constitutional: She is oriented to person, place, and time. She appears well-developed and well-nourished. No distress.  Well appearing  HENT:  Head: Normocephalic and atraumatic.  Right Ear: External ear normal.  Left Ear: External ear normal.  Nose: Nose normal.  Mouth/Throat: Oropharynx is clear and moist.  Eyes: Conjunctivae are normal.  Neck:  Normal range of motion.  Cardiovascular: Normal rate, regular rhythm and normal heart sounds.   Pulmonary/Chest: Effort normal and breath sounds normal. No stridor. No respiratory distress. She has no wheezes. She has no rales.  Abdominal: Soft. She exhibits no distension. There is tenderness in the suprapubic area. There is no rigidity and no guarding.  Genitourinary: There is no rash, tenderness, lesion or injury on the right labia. There is no rash, tenderness, lesion or injury on  the left labia. Uterus is tender. Uterus is not enlarged. Cervix exhibits discharge. Cervix exhibits no motion tenderness. Right adnexum displays no mass, no tenderness and no fullness. Left adnexum displays no mass, no tenderness and no fullness. No erythema around the vagina. No foreign body around the vagina. No signs of injury around the vagina. Vaginal discharge found.  Copious white vaginal discharge. No chandelier sign  Musculoskeletal: Normal range of motion.  Neurological: She is alert and oriented to person, place, and time. She has normal strength.  Skin: Skin is warm and dry. She is not diaphoretic. No erythema.  Psychiatric: She has a normal mood and affect. Her behavior is normal.    ED Course  Procedures (including critical care time) Labs Review Labs Reviewed  WET PREP, GENITAL - Abnormal; Notable for the following:    Trich, Wet Prep MANY (*)    WBC, Wet Prep HPF POC MANY (*)    All other components within normal limits  URINALYSIS, ROUTINE W REFLEX MICROSCOPIC - Abnormal; Notable for the following:    APPearance CLOUDY (*)    Hgb urine dipstick LARGE (*)    Leukocytes, UA LARGE (*)    All other components within normal limits  URINE MICROSCOPIC-ADD ON - Abnormal; Notable for the following:    Squamous Epithelial / LPF FEW (*)    Bacteria, UA FEW (*)    All other components within normal limits  GC/CHLAMYDIA PROBE AMP  PREGNANCY, URINE  HIV ANTIBODY (ROUTINE TESTING)  RPR    Imaging Review No results found.   EKG Interpretation None      MDM   Final diagnoses:  Trichomonal vaginitis  PID (acute pelvic inflammatory disease)  UTI (lower urinary tract infection)   Patient presents to the emergency department for vaginal discharge. She has a copious amount of white vaginal discharge on exam. She has suprapubic tenderness on exam. Wet prep shows many trichomoniasis cells. Given abdominal pain and vaginal discharge we will treat for possible PID. No concern  for a TOA, ectopic pregnancy, ovarian torsion. Discussed with the patient she cannot drink alcohol while taking Flagyl. She understands that her sexual partners need to be treated for Trichomonas and understands her to abstain from sexual intercourse. Discussed reasons to return to the emergency department immediately. Vital signs stable for discharge. Patient / Family / Caregiver informed of clinical course, understand medical decision-making process, and agree with plan.     Mora BellmanHannah S Merrell, PA-C 07/22/13 2024

## 2013-07-22 NOTE — ED Provider Notes (Signed)
Medical screening examination/treatment/procedure(s) were performed by non-physician practitioner and as supervising physician I was immediately available for consultation/collaboration.   EKG Interpretation None        [place] Paone N Aldora Perman, DO 07/22/13 2319 

## 2013-07-22 NOTE — Discharge Instructions (Signed)
Pelvic Inflammatory Disease Pelvic inflammatory disease (PID) is an infection in some or all of the female organs. PID can be in the uterus, ovaries, fallopian tubes, or the surrounding tissues inside the lower belly area (pelvis). HOME CARE   If given, take your antibiotic medicine as told. Finish them even if you start to feel better.  Only take medicine as told by your doctor.  Do not have sex (intercourse) until treatment is done or as told by your doctor.  Tell your sex partner if you have PID. Your partner may need to be treated.  Keep all doctor visits. GET HELP RIGHT AWAY IF:   You have a fever.  You have more belly (abdominal) or lower belly pain.  You have chills.  You have pain when you pee (urinate).  You are not better after 72 hours.  You have more fluid (discharge) coming from your vagina or fluid that is not normal.  You need pain medicine from your doctor.  You throw up (vomit).  You cannot take your medicines.  Your partner has a sexually transmitted disease (STD). MAKE SURE YOU:   Understand these instructions.  Will watch your condition.  Will get help right away if you are not doing well or get worse. Document Released: 04/08/2008 Document Revised: 05/07/2012 Document Reviewed: 01/06/2011 [facility]ExitCare Patient Information 2015 [place], [place]. This information is not intended to replace advice given to you by your health care provider. Make sure you discuss any questions you have with your health care provider.  Trichomoniasis Trichomoniasis is an infection caused by an organism called Trichomonas. The infection can affect both women and men. In women, the outer female genitalia and the vagina are affected. In men, the penis is mainly affected, but the prostate and other reproductive organs can also be involved. Trichomoniasis is a sexually transmitted infection (STI) and is most often passed to another person through sexual contact.  RISK  FACTORS  Having unprotected sexual intercourse.  Having sexual intercourse with an infected partner. SIGNS AND SYMPTOMS  Symptoms of trichomoniasis in women include:  Abnormal gray-green frothy vaginal discharge.  Itching and irritation of the vagina.  Itching and irritation of the area outside the vagina. Symptoms of trichomoniasis in men include:   Penile discharge with or without pain.  Pain during urination. This results from inflammation of the urethra. DIAGNOSIS  Trichomoniasis may be found during a Pap test or physical exam. Your health care provider may use one of the following methods to help diagnose this infection:  Examining vaginal discharge under a microscope. For men, urethral discharge would be examined.  Testing the pH of the vagina with a test tape.  Using a vaginal swab test that checks for the Trichomonas organism. A test is available that provides results within a few minutes.  Doing a culture test for the organism. This is not usually needed. TREATMENT   You may be given medicine to fight the infection. Women should inform their health care provider if they could be or are pregnant. Some medicines used to treat the infection should not be taken during pregnancy.  Your health care provider may recommend over-the-counter medicines or creams to decrease itching or irritation.  Your sexual partner will need to be treated if infected. HOME CARE INSTRUCTIONS   Take all medicine prescribed by your health care provider.  Take over-the-counter medicine for itching or irritation as directed by your health care provider.  Do not have sexual intercourse while you have the infection.  Women should not douche or wear tampons while they have the infection.  Discuss your infection with your partner. Your partner may have gotten the infection from you, or you may have gotten it from your partner.  Have your sex partner get examined and treated if  necessary.  Practice safe, informed, and protected sex.  See your health care provider for other STI testing. SEEK MEDICAL CARE IF:   You still have symptoms after you finish your medicine.  You develop abdominal pain.  You have pain when you urinate.  You have bleeding after sexual intercourse.  You develop a rash.  Your medicine makes you sick or makes you throw up (vomit). Document Released: 07/06/2000 Document Revised: 01/15/2013 Document Reviewed: 10/22/2012 [facility]Exit[facility] Patient Information 2015 [place], [place]LL[place]. This information is not intended to replace advice given to you by your health care provider. Make sure you discuss any questions you have with your health care provider.  Urinary Tract Infection A urinary tract infection (UTI) can occur any place along the urinary tract. The tract includes the kidneys, ureters, bladder, and urethra. A type of germ called bacteria often causes a UTI. UTIs are often helped with antibiotic medicine.  HOME CARE   If given, take antibiotics as told by your doctor. Finish them even if you start to feel better.  Drink enough fluids to keep your pee (urine) clear or pale yellow.  Avoid tea, drinks with caffeine, and bubbly (carbonated) drinks.  Pee often. Avoid holding your pee in for a long time.  Pee before and after having sex (intercourse).  Wipe from front to back after you poop (bowel movement) if you are a woman. Use each tissue only once. GET HELP RIGHT AWAY IF:   You have back pain.  You have lower belly (abdominal) pain.  You have chills.  You feel sick to your stomach (nauseous).  You throw up (vomit).  Your burning or discomfort with peeing does not go away.  You have a fever.  Your symptoms are not better in 3 days. MAKE SURE YOU:   Understand these instructions.  Will watch your condition.  Will get help right away if you are not doing well or get worse. Document Released: 06/29/2007 Document Revised:  10/05/2011 Document Reviewed: 08/11/2011 [facility]Exit[facility] Patient Information 2015 [place]Exit[place], [place]LL[place]. This information is not intended to replace advice given to you by your health care provider. Make sure you discuss any questions you have with your health care provider.

## 2013-07-22 NOTE — ED Notes (Signed)
Vital signs stable. 

## 2013-07-22 NOTE — ED Notes (Signed)
Pt in c/o lower abd pain and dysuria, states frequent urination and constantly feels the urge to urinate, also vaginal discharge

## 2013-07-23 LAB — GC/CHLAMYDIA PROBE AMP
CT Probe RNA: NEGATIVE
GC PROBE AMP APTIMA: NEGATIVE

## 2013-07-23 LAB — HIV ANTIBODY (ROUTINE TESTING W REFLEX): HIV 1&2 Ab, 4th Generation: NONREACTIVE

## 2013-11-25 ENCOUNTER — Encounter (HOSPITAL_COMMUNITY): Payer: Self-pay | Admitting: Emergency Medicine

## 2014-02-12 ENCOUNTER — Emergency Department (HOSPITAL_COMMUNITY)
Admission: EM | Admit: 2014-02-12 | Discharge: 2014-02-12 | Disposition: A | Discharge status: Home / Self Care | Payer: No Typology Code available for payment source | Attending: Emergency Medicine | Admitting: Emergency Medicine

## 2014-02-12 ENCOUNTER — Encounter (HOSPITAL_COMMUNITY): Payer: Self-pay | Admitting: Emergency Medicine

## 2014-02-12 DIAGNOSIS — Z720 Tobacco use: Secondary | ICD-10-CM | POA: Insufficient documentation

## 2014-02-12 DIAGNOSIS — A5901 Trichomonal vulvovaginitis: Secondary | ICD-10-CM | POA: Insufficient documentation

## 2014-02-12 DIAGNOSIS — N760 Acute vaginitis: Secondary | ICD-10-CM | POA: Insufficient documentation

## 2014-02-12 DIAGNOSIS — H9202 Otalgia, left ear: Secondary | ICD-10-CM | POA: Insufficient documentation

## 2014-02-12 DIAGNOSIS — Z3202 Encounter for pregnancy test, result negative: Secondary | ICD-10-CM | POA: Insufficient documentation

## 2014-02-12 DIAGNOSIS — J029 Acute pharyngitis, unspecified: Secondary | ICD-10-CM | POA: Insufficient documentation

## 2014-02-12 DIAGNOSIS — Z792 Long term (current) use of antibiotics: Secondary | ICD-10-CM | POA: Insufficient documentation

## 2014-02-12 DIAGNOSIS — J45909 Unspecified asthma, uncomplicated: Secondary | ICD-10-CM | POA: Insufficient documentation

## 2014-02-12 DIAGNOSIS — B9689 Other specified bacterial agents as the cause of diseases classified elsewhere: Secondary | ICD-10-CM

## 2014-02-12 DIAGNOSIS — A599 Trichomoniasis, unspecified: Secondary | ICD-10-CM

## 2014-02-12 LAB — WET PREP, GENITAL: YEAST WET PREP: NONE SEEN

## 2014-02-12 LAB — URINALYSIS, ROUTINE W REFLEX MICROSCOPIC
Bilirubin Urine: NEGATIVE
GLUCOSE, UA: NEGATIVE mg/dL
Hgb urine dipstick: NEGATIVE
KETONES UR: NEGATIVE mg/dL
Nitrite: NEGATIVE
PH: 5.5 (ref 5.0–8.0)
PROTEIN: NEGATIVE mg/dL
Specific Gravity, Urine: 1.039 — ABNORMAL HIGH (ref 1.005–1.030)
Urobilinogen, UA: 0.2 mg/dL (ref 0.0–1.0)

## 2014-02-12 LAB — URINE MICROSCOPIC-ADD ON

## 2014-02-12 LAB — POC URINE PREG, ED: PREG TEST UR: NEGATIVE

## 2014-02-12 MED ORDER — AZITHROMYCIN 250 MG PO TABS
1000.0000 mg | ORAL_TABLET | Freq: Once | ORAL | Status: AC
  Administered 2014-02-12: 1000 mg via ORAL
  Filled 2014-02-12: qty 4

## 2014-02-12 MED ORDER — METRONIDAZOLE 500 MG PO TABS
2000.0000 mg | ORAL_TABLET | Freq: Once | ORAL | Status: AC
  Administered 2014-02-12: 2000 mg via ORAL
  Filled 2014-02-12: qty 4

## 2014-02-12 MED ORDER — CEFTRIAXONE SODIUM 250 MG IJ SOLR
250.0000 mg | Freq: Once | INTRAMUSCULAR | Status: AC
  Administered 2014-02-12: 250 mg via INTRAMUSCULAR
  Filled 2014-02-12: qty 250

## 2014-02-12 MED ORDER — METRONIDAZOLE 500 MG PO TABS: 500.0000 mg | ORAL_TABLET | Freq: Two times a day (BID) | ORAL | Status: DC

## 2014-02-12 NOTE — Discharge Instructions (Signed)
You have been diagnosed with having trichomonas and bacterial vaginosis infection.  Take antibiotic as prescribed for the full duration.  Follow instruction below.    Bacterial Vaginosis Bacterial vaginosis is an infection of the vagina. It happens when too many of certain germs (bacteria) grow in the vagina. HOME CARE  Take your medicine as told by your doctor.  Finish your medicine even if you start to feel better.  Do not have sex until you finish your medicine and are better.  Tell your sex partner that you have an infection. They should see their doctor for treatment.  Practice safe sex. Use condoms. Have only one sex partner. GET HELP IF:  You are not getting better after 3 days of treatment.  You have more grey fluid (discharge) coming from your vagina than before.  You have more pain than before.  You have a fever. MAKE SURE YOU:   Understand these instructions.  Will watch your condition.  Will get help right away if you are not doing well or get worse. Document Released: 10/20/2007 Document Revised: 10/31/2012 Document Reviewed: 08/22/2012 [facility] Patient Information 2015 [place], [place]. This information is not intended to replace advice given to you by your health care provider. Make sure you discuss any questions you have with your health care provider.  Trichomoniasis Trichomoniasis is an infection caused by an organism called Trichomonas. The infection can affect both women and men. In women, the outer female genitalia and the vagina are affected. In men, the penis is mainly affected, but the prostate and other reproductive organs can also be involved. Trichomoniasis is a sexually transmitted infection (STI) and is most often passed to another person through sexual contact.  RISK FACTORS  Having unprotected sexual intercourse.  Having sexual intercourse with an infected partner. SIGNS AND SYMPTOMS  Symptoms of trichomoniasis in women include:  Abnormal  gray-green frothy vaginal discharge.  Itching and irritation of the vagina.  Itching and irritation of the area outside the vagina. Symptoms of trichomoniasis in men include:   Penile discharge with or without pain.  Pain during urination. This results from inflammation of the urethra. DIAGNOSIS  Trichomoniasis may be found during a Pap test or physical exam. Your health care provider may use one of the following methods to help diagnose this infection:  Examining vaginal discharge under a microscope. For men, urethral discharge would be examined.  Testing the pH of the vagina with a test tape.  Using a vaginal swab test that checks for the Trichomonas organism. A test is available that provides results within a few minutes.  Doing a culture test for the organism. This is not usually needed. TREATMENT   You may be given medicine to fight the infection. Women should inform their health care provider if they could be or are pregnant. Some medicines used to treat the infection should not be taken during pregnancy.  Your health care provider may recommend over-the-counter medicines or creams to decrease itching or irritation.  Your sexual partner will need to be treated if infected. HOME CARE INSTRUCTIONS   Take medicines only as directed by your health care provider.  Take over-the-counter medicine for itching or irritation as directed by your health care provider.  Do not have sexual intercourse while you have the infection.  Women should not douche or wear tampons while they have the infection.  Discuss your infection with your partner. Your partner may have gotten the infection from you, or you may have gotten it from your  partner.  Have your sex partner get examined and treated if necessary.  Practice safe, informed, and protected sex.  See your health care provider for other STI testing. SEEK MEDICAL CARE IF:   You still have symptoms after you finish your  medicine.  You develop abdominal pain.  You have pain when you urinate.  You have bleeding after sexual intercourse.  You develop a rash.  Your medicine makes you sick or makes you throw up (vomit). MAKE SURE YOU:  Understand these instructions.  Will watch your condition.  Will get help right away if you are not doing well or get worse. Document Released: 07/06/2000 Document Revised: 05/27/2013 Document Reviewed: 10/22/2012 [facility]Exit[facility] Patient Information 2015 [place]Exit[place], [place]LL[place]. This information is not intended to replace advice given to you by your health care provider. Make sure you discuss any questions you have with your health care provider.

## 2014-02-12 NOTE — ED Notes (Signed)
Pt c/o sore throat and otalgia x 2 weeks.  Also c/o vaginal discharge x 1 wk.  Yellow discharge.  No pain.

## 2014-02-12 NOTE — ED Provider Notes (Signed)
CSN: [id number]     Arrival date & time 02/12/14  1348 History   First MD Initiated Contact with Patient 02/12/14 1422     Chief Complaint  Patient presents with  . Sore Throat  . Otalgia  . Vaginal Discharge     (Consider location/radiation/quality/duration/timing/severity/associated sxs/prior Treatment) HPI   34 year old female with prior history of STD presents for evaluation of multiple complaints. Patient states 3 weeks ago she developed URI symptoms with runny nose sneezing coughing and sore throat. States the symptoms has mostly resolved however she has had intermittent sore throat and intermittent left ear pain. Pain is minimal but not fully resolved. No associated fever, chills, hearing loss, ringing in ears, trouble swallowing, severe neck pain, chest pain shortness of breath or productive cough. Patient also endorsed vaginal discharge ongoing for the past week with ordered. She admits to having prior history of STD including trichomonas. She is sexually active with one partner but not using protection. She denies any pain with sexual activities, denies any dysuria, vaginal bleeding, or rash. Her last menstrual period was 01/12/2014.  Past Medical History  Diagnosis Date  . Asthma    No past surgical history on file. No family history on file. History  Substance Use Topics  . Smoking status: Current Some Day Smoker  . Smokeless tobacco: Not on file  . Alcohol Use: No   OB History    Gravida Para Term Preterm AB TAB SAB Ectopic Multiple Living   1 0   1  1        Review of Systems  All other systems reviewed and are negative.     Allergies  Bee venom  Home Medications   Prior to Admission medications   Medication Sig Start Date End Date Taking? Authorizing Provider  Pseudoeph-Doxylamine-DM-APAP (NYQUIL PO) Take 1 tablet by mouth at bedtime as needed (Cold symptoms).   Yes Historical Provider, MD  cephALEXin (KEFLEX) 500 MG capsule Take 1 capsule (500 mg total)  by mouth 4 (four) times daily. Patient not taking: Reported on 02/12/2014 07/22/13   Mora Bellman, PA-C  doxycycline (VIBRAMYCIN) 100 MG capsule Take 1 capsule (100 mg total) by mouth 2 (two) times daily. Patient not taking: Reported on 02/12/2014 07/22/13   Mora Bellman, PA-C  metroNIDAZOLE (FLAGYL) 500 MG tablet Take 1 tablet (500 mg total) by mouth 2 (two) times daily. Patient not taking: Reported on 02/12/2014 07/22/13   Mora Bellman, PA-C  promethazine (PHENERGAN) 25 MG tablet Take 1 tablet (25 mg total) by mouth every 6 (six) hours as needed for nausea or vomiting. Patient not taking: Reported on 02/12/2014 07/22/13   Mora Bellman, PA-C   BP 144/85 mmHg  Pulse 77  Temp(Src) 97.9 F (36.6 C) (Oral)  Resp 18  SpO2 100%  LMP 01/12/2014 Physical Exam  Constitutional: She appears well-developed and well-nourished. No distress.  HENT:  Head: Atraumatic.  Right Ear: External ear normal.  Left Ear: External ear normal.  Mouth/Throat: Oropharynx is clear and moist.  Eyes: Conjunctivae are normal.  Neck: Neck supple.  No nuchal rigidity  Cardiovascular: Normal rate and regular rhythm.   Pulmonary/Chest: Effort normal and breath sounds normal.  Abdominal: Soft. She exhibits no distension. There is no tenderness.  Genitourinary:  Chaperone present:  No inguinal lymphadenopathy, normal external genitalia free of lesion or rash. Vaginal vault with moderate amount of yellow discharge, cervical os is visualized and closed. No significant pain with insertion of speculum. On bimanual examination no  tenderness to adnexal region and no cervical motion tenderness.  Lymphadenopathy:    She has no cervical adenopathy.  Neurological: She is alert.  Skin: No rash noted.  Psychiatric: She has a normal mood and affect.  Nursing note and vitals reviewed.   ED Course  Procedures (including critical care time)  2:33 PM Patient here with URI symptoms. On examination she has an  unremarkable throat exam and is appear normal. Low suspicion for bacterial infection. Recommend symptomatically treatment. She also complains of vaginal discharge. Workup initiated.  2:49 PM On pelvic examination, no evidence to suggest PID.  3:45 PM Wet prep shows evidence of trichomonas infection, improved from prior values however not completely resolved. Patient also has evidence of bacterial vaginosis as well. UA shows no evidence of urinary tract infection and her pregnancy test is negative. I discussed finding with patient and patient request for antibody treatment. Patient will receive Rocephin, Zithromax, and Flagyl. Patient made aware not to drink alcohol while taking antibiotic and also recommend eating yogurts to decrease risk of yeast infection or diarrhea. Patient also agrees not to have any sexual activities until symptoms completely resolved. She will need to notify her partner if her test, cultures are positive for STD. Otherwise she is stable for discharge. Return precautions discussed.  Labs Review Labs Reviewed  WET PREP, GENITAL - Abnormal; Notable for the following:    Trich, Wet Prep FEW (*)    Clue Cells Wet Prep HPF POC MODERATE (*)    WBC, Wet Prep HPF POC TOO NUMEROUS TO COUNT (*)    All other components within normal limits  URINALYSIS, ROUTINE W REFLEX MICROSCOPIC - Abnormal; Notable for the following:    Color, Urine AMBER (*)    APPearance CLOUDY (*)    Specific Gravity, Urine 1.039 (*)    Leukocytes, UA SMALL (*)    All other components within normal limits  URINE MICROSCOPIC-ADD ON - Abnormal; Notable for the following:    Squamous Epithelial / LPF FEW (*)    All other components within normal limits  RPR  HIV ANTIBODY (ROUTINE TESTING)  POC URINE PREG, ED  GC/CHLAMYDIA PROBE AMP ([place])    Imaging Review No results found.   EKG Interpretation None      MDM   Final diagnoses:  Trichomonal infection  BV (bacterial vaginosis)    BP  144/85 mmHg  Pulse 77  Temp(Src) 97.9 F (36.6 C) (Oral)  Resp 18  SpO2 100%  LMP 01/12/2014  I have reviewed nursing notes and vital signs. I personally reviewed the imaging tests through PACS system  I reviewed available ER/hospitalization records thought the EMR     Fayrene HelperBowie Arisha Gervais, PA-C 02/12/14 1549  Tilden FossaElizabeth Rees, MD 02/12/14 [phone]

## 2014-02-13 LAB — HIV ANTIBODY (ROUTINE TESTING W REFLEX)
HIV 1/HIV 2 AB: NONREACTIVE
HIV 1/O/2 Abs-Index Value: <1 (ref <1.00)

## 2014-02-13 LAB — RPR: RPR: NONREACTIVE

## 2014-02-13 LAB — GC/CHLAMYDIA PROBE AMP (~~LOC~~) NOT AT ARMC
Chlamydia: NEGATIVE
Neisseria Gonorrhea: NEGATIVE

## 2014-10-25 ENCOUNTER — Encounter (HOSPITAL_COMMUNITY): Payer: Self-pay | Admitting: Emergency Medicine

## 2014-10-25 ENCOUNTER — Emergency Department (HOSPITAL_COMMUNITY)
Admission: EM | Admit: 2014-10-25 | Discharge: 2014-10-25 | Disposition: A | Discharge status: Home / Self Care | Payer: No Typology Code available for payment source | Attending: Emergency Medicine | Admitting: Emergency Medicine

## 2014-10-25 DIAGNOSIS — K0381 Cracked tooth: Secondary | ICD-10-CM | POA: Insufficient documentation

## 2014-10-25 DIAGNOSIS — J45909 Unspecified asthma, uncomplicated: Secondary | ICD-10-CM | POA: Insufficient documentation

## 2014-10-25 DIAGNOSIS — K0889 Other specified disorders of teeth and supporting structures: Secondary | ICD-10-CM

## 2014-10-25 DIAGNOSIS — K047 Periapical abscess without sinus: Secondary | ICD-10-CM | POA: Insufficient documentation

## 2014-10-25 DIAGNOSIS — Z720 Tobacco use: Secondary | ICD-10-CM | POA: Insufficient documentation

## 2014-10-25 MED ORDER — PENICILLIN V POTASSIUM 500 MG PO TABS: 500.0000 mg | ORAL_TABLET | Freq: Three times a day (TID) | ORAL | Status: DC

## 2014-10-25 MED ORDER — HYDROCODONE-ACETAMINOPHEN 5-325 MG PO TABS: 2.0000 | ORAL_TABLET | ORAL | Status: DC | PRN

## 2014-10-25 NOTE — ED Provider Notes (Signed)
CSN: [id number]     Arrival date & time 10/25/14  1410 History  By signing my name below, I, Olivia Giles, attest that this documentation has been prepared under the direction and in the presence of Arthor Captain, PA-C.  Electronically Signed: Tanda Giles, ED Scribe. 10/25/2014. 3:51 PM.  Chief Complaint  Patient presents with  . Dental Pain   The history is provided by the patient. No language interpreter was used.     HPI Comments: Olivia Giles is a 34 y.o. female who presents to the Emergency Department complaining of gradual onset, constant, moderate, right upper and lower dental pain x 2 days. The pain is exacerbated with breathing and cold exposure. She has been taking Ibuprofen and Advil without relief.  Pt notes right sided facial swelling x 1 day as well. Denies difficulty swallowing, shortness of breath, fever, chills, or any other associated symptoms.   Past Medical History  Diagnosis Date  . Asthma    History reviewed. No pertinent past surgical history. No family history on file. Social History  Substance Use Topics  . Smoking status: Current Some Day Smoker  . Smokeless tobacco: None  . Alcohol Use: No   OB History    Gravida Para Term Preterm AB TAB SAB Ectopic Multiple Living   1 0   1  1        Review of Systems  Constitutional: Negative for fever and chills.  HENT: Positive for dental problem and facial swelling. Negative for trouble swallowing.   Respiratory: Negative for shortness of breath.    Allergies  Bee venom  Home Medications   Prior to Admission medications   Medication Sig Start Date End Date Taking? Authorizing Provider  ibuprofen (ADVIL,MOTRIN) 200 MG tablet Take 200-400 mg by mouth every 6 (six) hours as needed for headache, mild pain or moderate pain.   Yes Historical Provider, MD   Triage Vitals: BP 119/67 mmHg  Pulse 74  Temp(Src) 97.6 F (36.4 C) (Oral)  Resp 11  SpO2 94%   Physical Exam  Constitutional: She is oriented  to person, place, and time. She appears well-developed and well-nourished. No distress.  HENT:  Head: Normocephalic and atraumatic.  Mouth/Throat: Oropharynx is clear and moist.  Broken first molar. Fluctuance on the gum under the tooth. Some swelling on the right side of face.      Eyes: Conjunctivae and EOM are normal.  Neck: Neck supple. No tracheal deviation present.  Cardiovascular: Normal rate.   Pulmonary/Chest: Effort normal. No respiratory distress.  Musculoskeletal: Normal range of motion.  Neurological: She is alert and oriented to person, place, and time.  Skin: Skin is warm and dry.  Psychiatric: She has a normal mood and affect. Her behavior is normal.  Nursing note and vitals reviewed.   ED Course  Procedures (including critical care time)  DIAGNOSTIC STUDIES: Oxygen Saturation is 94% on RA, normal by my interpretation.    COORDINATION OF CARE: 3:50 PM-Discussed treatment plan which includes Rx antibiotics and referral to dentist with pt at bedside and pt agreed to plan.   Labs Review Labs Reviewed - No data to display  Imaging Review No results found.   EKG Interpretation None      MDM   Final diagnoses:  None   Pt has developing apical abscess.  Patient with toothache.  No gross abscess.  Exam unconcerning for Ludwig's angina or spread of infection.  Will treat with penicillin and pain medicine.  Urged patient to follow-up  with dentist.     I personally performed the services described in this documentation, which was scribed in my presence. The recorded information has been reviewed and is accurate.       Arthor Captain, PA-C 10/25/14 1608  Raeford Razor, MD 10/25/14 1726

## 2014-10-25 NOTE — ED Notes (Signed)
Patient complains of pain to the teeth on both the upper and lower right side of her mouth x2 days.  The patient's right cheek is also swollen.

## 2014-10-25 NOTE — Discharge Instructions (Signed)
Dental Abscess A dental abscess is a collection of infected fluid (pus) from a bacterial infection in the inner part of the tooth (pulp). It usually occurs at the end of the tooth's root.  CAUSES   Severe tooth decay.  Trauma to the tooth that allows bacteria to enter into the pulp, such as a broken or chipped tooth. SYMPTOMS   Severe pain in and around the infected tooth.  Swelling and redness around the abscessed tooth or in the mouth or face.  Tenderness.  Pus drainage.  Bad breath.  Bitter taste in the mouth.  Difficulty swallowing.  Difficulty opening the mouth.  Nausea.  Vomiting.  Chills.  Swollen neck glands. DIAGNOSIS   A medical and dental history will be taken.  An examination will be performed by tapping on the abscessed tooth.  X-rays may be taken of the tooth to identify the abscess. TREATMENT The goal of treatment is to eliminate the infection. You may be prescribed antibiotic medicine to stop the infection from spreading. A root canal may be performed to save the tooth. If the tooth cannot be saved, it may be pulled (extracted) and the abscess may be drained.  HOME CARE INSTRUCTIONS  Only take over-the-counter or prescription medicines for pain, fever, or discomfort as directed by your caregiver.  Rinse your mouth (gargle) often with salt water ( tsp salt in 8 oz [250 ml] of warm water) to relieve pain or swelling.  Do not drive after taking pain medicine (narcotics).  Do not apply heat to the outside of your face.  Return to your dentist for further treatment as directed. SEEK MEDICAL CARE IF:  Your pain is not helped by medicine.  Your pain is getting worse instead of better. SEEK IMMEDIATE MEDICAL CARE IF:  You have a fever or persistent symptoms for more than 2-3 days.  You have a fever and your symptoms suddenly get worse.  You have chills or a very bad headache.  You have problems breathing or swallowing.  You have trouble  opening your mouth.  You have swelling in the neck or around the eye. Document Released: 01/10/2005 Document Revised: 10/05/2011 Document Reviewed: 04/20/2010 [facility] Patient Information 2015 [place], [place]. This information is not intended to replace advice given to you by your health care provider. Make sure you discuss any questions you have with your health care provider.  [facility]  [facility]  [address]  [place], [place] [address]  Phone [phone]  The [employer] [facility] [place] exemplifies the [employer] vision to improve the health and quality of life of all [place]ians by Public house manager with a passion to care for the underserved and by leading the nation in community-based, service learning oral health education. We are committed to offering comprehensive general dental services for adults, children and special needs patients in a safe, caring and professional setting.  Appointments: Our clinic is open Monday through Friday 8:00 a.m. until 5:00 p.m. The amount of time scheduled for an appointment depends on the patients specific needs. We ask that you keep your appointed time for care or provide 24-hour notice of all appointment changes. Parents or legal guardians must accompany minor children.  Payment for Services: Medicaid and other insurance plans are welcome. Payment for services is due when services are rendered and may be made by cash or credit card. If you have  insurance, we will assist you with your claim submission. °   °Emergencies:  Emergency services will be provided Monday through Friday on a walk-in basis.  Please arrive early for emergency services. After hours emergency services will be provided for patients of record as required. °  °Services:  °Comprehensive General  Dentistry °Children’s Dentistry °Oral Surgery - Extractions °Root Canals °Sealants and Tooth Colored Fillings °Crowns and Bridges °Dentures and Partial Dentures °Implant Services °Periodontal Services and Cleanings °Cosmetic Tooth Whitening °Digital Radiography °3-D/[facility] Beam Imaging °

## 2014-10-27 ENCOUNTER — Encounter (HOSPITAL_COMMUNITY): Payer: Self-pay | Admitting: Emergency Medicine

## 2014-10-27 ENCOUNTER — Emergency Department (HOSPITAL_COMMUNITY)
Admission: EM | Admit: 2014-10-27 | Discharge: 2014-10-27 | Disposition: A | Discharge status: Home / Self Care | Payer: No Typology Code available for payment source | Attending: Emergency Medicine | Admitting: Emergency Medicine

## 2014-10-27 DIAGNOSIS — Z792 Long term (current) use of antibiotics: Secondary | ICD-10-CM | POA: Insufficient documentation

## 2014-10-27 DIAGNOSIS — J45909 Unspecified asthma, uncomplicated: Secondary | ICD-10-CM | POA: Insufficient documentation

## 2014-10-27 DIAGNOSIS — Z720 Tobacco use: Secondary | ICD-10-CM | POA: Insufficient documentation

## 2014-10-27 DIAGNOSIS — K002 Abnormalities of size and form of teeth: Secondary | ICD-10-CM | POA: Insufficient documentation

## 2014-10-27 DIAGNOSIS — K047 Periapical abscess without sinus: Secondary | ICD-10-CM | POA: Insufficient documentation

## 2014-10-27 NOTE — ED Notes (Signed)
Pt reports was seen here on Saturday for tooth ache and possible abscess. Pt started ATB yesterday and started having "swelling of right upper gum" in same tooth area.. It appears to be an abscess. Pt denies tongue swelling nor sob, no hives either. Pt alert and oriented x 4.

## 2014-10-27 NOTE — ED Provider Notes (Signed)
CSN: [id number]     Arrival date & time 10/27/14  1000 History   First MD Initiated Contact with Patient 10/27/14 1013     Chief Complaint  Patient presents with  . tooth abscess     right upper      (Consider location/radiation/quality/duration/timing/severity/associated sxs/prior Treatment) HPI Comments: Patient presents emergency department with chief complaint of dental abscess. She states that she was seen here 2 days ago for the same. She was started on penicillin. She states that she became concerned today when she awoke with some increased swelling. She is concerned she is having allergic reaction to penicillin. She denies any prior history of allergies to penicillin. She denies any rashes, difficulty breathing, nausea, vomiting, or diarrhea. There are no aggravating or alleviating factors.  The history is provided by the patient. No language interpreter was used.    Past Medical History  Diagnosis Date  . Asthma    History reviewed. No pertinent past surgical history. No family history on file. Social History  Substance Use Topics  . Smoking status: Current Some Day Smoker  . Smokeless tobacco: None  . Alcohol Use: No   OB History    Gravida Para Term Preterm AB TAB SAB Ectopic Multiple Living   1 0   1  1        Review of Systems  Constitutional: Negative for fever and chills.  HENT: Positive for dental problem. Negative for drooling.   Skin: Negative for rash.  Neurological: Negative for speech difficulty.  Psychiatric/Behavioral: Positive for sleep disturbance.      Allergies  Bee venom  Home Medications   Prior to Admission medications   Medication Sig Start Date End Date Taking? Authorizing Provider  HYDROcodone-acetaminophen (NORCO) 5-325 MG tablet Take 2 tablets by mouth every 4 (four) hours as needed. 10/25/14   Arthor Captain, PA-C  ibuprofen (ADVIL,MOTRIN) 200 MG tablet Take 200-400 mg by mouth every 6 (six) hours as needed for headache, mild pain  or moderate pain.    Historical Provider, MD  penicillin v potassium (VEETID) 500 MG tablet Take 1 tablet (500 mg total) by mouth 3 (three) times daily. 10/25/14   Abigail Harris, PA-C   BP 138/83 mmHg  Pulse 84  Temp(Src) 98.5 F (36.9 C) (Oral)  Resp 18  SpO2 98%  LMP 10/25/2014 Physical Exam  Constitutional: She is oriented to person, place, and time. She appears well-developed and well-nourished.  HENT:  Head: Normocephalic and atraumatic.  Mouth/Throat:    Poor dentition throughout.  Affected tooth as diagrammed.  No signs of peritonsillar or tonsillar abscess.  No signs of gingival abscess. Oropharynx is clear and without exudates.  Uvula is midline.  Airway is intact. No signs of Ludwig's angina with palpation of oral and sublingual mucosa.   Eyes: Conjunctivae and EOM are normal.  Neck: Normal range of motion.  Cardiovascular: Normal rate.   Pulmonary/Chest: Effort normal.  Abdominal: She exhibits no distension.  Musculoskeletal: Normal range of motion.  Neurological: She is alert and oriented to person, place, and time.  Skin: Skin is dry.  Psychiatric: She has a normal mood and affect. Her behavior is normal. Judgment and thought content normal.  Nursing note and vitals reviewed.   ED Course  Procedures (including critical care time)   MDM   Final diagnoses:  Dental abscess    Patient with concern for allergic reaction. She has no signs or symptoms of drug reaction. Suspect that patient's symptoms are related to dental abscess,  and mildly increased swelling.  Exam unconcerning for Ludwig's angina or spread of infection.  Will continue treatment with penicillin and pain medicine.  Urged patient to follow-up with dentist.       Roxy Horseman, PA-C 10/27/14 1115  Azalia Bilis, MD 10/27/14 1640

## 2014-10-27 NOTE — Discharge Instructions (Signed)

## 2016-05-21 ENCOUNTER — Emergency Department (HOSPITAL_COMMUNITY)
Admission: EM | Admit: 2016-05-21 | Discharge: 2016-05-21 | Disposition: A | Discharge status: Home / Self Care | Payer: No Typology Code available for payment source | Attending: Emergency Medicine | Admitting: Emergency Medicine

## 2016-05-21 ENCOUNTER — Encounter (HOSPITAL_COMMUNITY): Payer: Self-pay

## 2016-05-21 DIAGNOSIS — Z87891 Personal history of nicotine dependence: Secondary | ICD-10-CM | POA: Insufficient documentation

## 2016-05-21 DIAGNOSIS — N3001 Acute cystitis with hematuria: Secondary | ICD-10-CM | POA: Insufficient documentation

## 2016-05-21 DIAGNOSIS — J45909 Unspecified asthma, uncomplicated: Secondary | ICD-10-CM | POA: Insufficient documentation

## 2016-05-21 DIAGNOSIS — N9489 Other specified conditions associated with female genital organs and menstrual cycle: Secondary | ICD-10-CM | POA: Insufficient documentation

## 2016-05-21 LAB — COMPREHENSIVE METABOLIC PANEL
ALT: 52 U/L (ref 14–54)
AST: 40 U/L (ref 15–41)
Albumin: 3.6 g/dL (ref 3.5–5.0)
Alkaline Phosphatase: 73 U/L (ref 38–126)
Anion gap: 8 (ref 5–15)
BUN: 10 mg/dL (ref 6–20)
CHLORIDE: 104 mmol/L (ref 101–111)
CO2: 24 mmol/L (ref 22–32)
CREATININE: 0.82 mg/dL (ref 0.44–1.00)
Calcium: 8.8 mg/dL — ABNORMAL LOW (ref 8.9–10.3)
GFR calc non Af Amer: >60 mL/min (ref >60)
Glucose, Bld: 102 mg/dL — ABNORMAL HIGH (ref 65–99)
POTASSIUM: 3.1 mmol/L — AB (ref 3.5–5.1)
SODIUM: 136 mmol/L (ref 135–145)
Total Bilirubin: 0.5 mg/dL (ref 0.3–1.2)
Total Protein: 7.8 g/dL (ref 6.5–8.1)

## 2016-05-21 LAB — CBC
HEMATOCRIT: 35.8 % — AB (ref 36.0–46.0)
Hemoglobin: 11.9 g/dL — ABNORMAL LOW (ref 12.0–15.0)
MCH: 29.3 pg (ref 26.0–34.0)
MCHC: 33.2 g/dL (ref 30.0–36.0)
MCV: 88.2 fL (ref 78.0–100.0)
Platelets: 303 10*3/uL (ref 150–400)
RBC: 4.06 MIL/uL (ref 3.87–5.11)
RDW: 13.3 % (ref 11.5–15.5)
WBC: 8 10*3/uL (ref 4.0–10.5)

## 2016-05-21 LAB — URINALYSIS, ROUTINE W REFLEX MICROSCOPIC
Bilirubin Urine: NEGATIVE
Glucose, UA: NEGATIVE mg/dL
KETONES UR: NEGATIVE mg/dL
Nitrite: NEGATIVE
Protein, ur: 100 mg/dL — AB
SQUAMOUS EPITHELIAL / LPF: NONE SEEN
Specific Gravity, Urine: 1.024 (ref 1.005–1.030)
pH: 5 (ref 5.0–8.0)

## 2016-05-21 LAB — WET PREP, GENITAL
SPERM: NONE SEEN
Trich, Wet Prep: NONE SEEN
YEAST WET PREP: NONE SEEN

## 2016-05-21 LAB — LIPASE, BLOOD: LIPASE: 35 U/L (ref 11–51)

## 2016-05-21 LAB — HCG, QUANTITATIVE, PREGNANCY: hCG, Beta Chain, Quant, S: 1 m[IU]/mL (ref <5)

## 2016-05-21 MED ORDER — CEPHALEXIN 250 MG PO CAPS
500.0000 mg | ORAL_CAPSULE | Freq: Once | ORAL | Status: AC
  Administered 2016-05-21: 500 mg via ORAL
  Filled 2016-05-21: qty 2

## 2016-05-21 MED ORDER — CEPHALEXIN 500 MG PO CAPS: 500.0000 mg | ORAL_CAPSULE | Freq: Two times a day (BID) | ORAL | 0 refills | Status: DC

## 2016-05-21 NOTE — ED Provider Notes (Signed)
[facility]-EMERGENCY DEPT Provider Note   CSN: [id number] Arrival date & time: 05/21/16  0111     History   Chief Complaint Chief Complaint  Patient presents with  . Dysuria    HPI Olivia Giles is a 36 y.o. female no sig PMH here with 2 days of dysuria and hematuria.  She describes the pressure sensation like she has to go, but then nothing comes out on the toilet. She states this is consistent with her prior UTIs. She denies any back pain or fevers. She has not had any vaginal DC and does not believe she has an STD.  She has not taken anything for this at home. There are no further complaints. .  10 Systems reviewed and are negative for acute change except as noted in the HPI.   HPI  Past Medical History:  Diagnosis Date  . Asthma     There are no active problems to display for this patient.   History reviewed. No pertinent surgical history.  OB History    Gravida Para Term Preterm AB Living   1 0     1     SAB TAB Ectopic Multiple Live Births   1               Home Medications    Prior to Admission medications   Medication Sig Start Date End Date Taking? Authorizing Provider  cephALEXin (KEFLEX) 500 MG capsule Take 1 capsule (500 mg total) by mouth 2 (two) times daily. 05/21/16   Tomasita Crumble, MD  HYDROcodone-acetaminophen (NORCO) 5-325 MG tablet Take 2 tablets by mouth every 4 (four) hours as needed. 10/25/14   Arthor Captain, PA-C  ibuprofen (ADVIL,MOTRIN) 200 MG tablet Take 200-400 mg by mouth every 6 (six) hours as needed for headache, mild pain or moderate pain.    Historical Provider, MD  penicillin v potassium (VEETID) 500 MG tablet Take 1 tablet (500 mg total) by mouth 3 (three) times daily. 10/25/14   Arthor Captain, PA-C    Family History History reviewed. No pertinent family history.  Social History Social History  Substance Use Topics  . Smoking status: Former Games developer  . Smokeless tobacco: Never Used  . Alcohol use No     Allergies   Bee  venom   Review of Systems Review of Systems   Physical Exam Updated Vital Signs BP 105/65   Pulse 92   Temp 99 F (37.2 C) (Oral)   Resp 20   LMP 05/17/2016 (Exact Date)   SpO2 99%   Physical Exam  Constitutional: She is oriented to person, place, and time. She appears well-developed and well-nourished. No distress.  HENT:  Head: Normocephalic and atraumatic.  Nose: Nose normal.  Mouth/Throat: Oropharynx is clear and moist. No oropharyngeal exudate.  Eyes: Conjunctivae and EOM are normal. Pupils are equal, round, and reactive to light. No scleral icterus.  Neck: Normal range of motion. Neck supple. No JVD present. No tracheal deviation present. No thyromegaly present.  Cardiovascular: Normal rate, regular rhythm and normal heart sounds.  Exam reveals no gallop and no friction rub.   No murmur heard. Pulmonary/Chest: Effort normal and breath sounds normal. No respiratory distress. She has no wheezes. She exhibits no tenderness.  Abdominal: Soft. Bowel sounds are normal. She exhibits no distension and no mass. There is no tenderness. There is no rebound and no guarding.  Genitourinary: No vaginal discharge found.  Genitourinary Comments: No CMT or adnexal tenderness.  Musculoskeletal: Normal range of motion. She  exhibits no edema or tenderness.  Lymphadenopathy:    She has no cervical adenopathy.  Neurological: She is alert and oriented to person, place, and time. No cranial nerve deficit. She exhibits normal muscle tone.  Skin: Skin is warm and dry. No rash noted. No erythema. No pallor.  Nursing note and vitals reviewed.    ED Treatments / Results  Labs (all labs ordered are listed, but only abnormal results are displayed) Labs Reviewed  WET PREP, GENITAL - Abnormal; Notable for the following:       Result Value   Clue Cells Wet Prep HPF POC PRESENT (*)    WBC, Wet Prep HPF POC FEW (*)    All other components within normal limits  COMPREHENSIVE METABOLIC PANEL -  Abnormal; Notable for the following:    Potassium 3.1 (*)    Glucose, Bld 102 (*)    Calcium 8.8 (*)    All other components within normal limits  CBC - Abnormal; Notable for the following:    Hemoglobin 11.9 (*)    HCT 35.8 (*)    All other components within normal limits  URINALYSIS, ROUTINE W REFLEX MICROSCOPIC - Abnormal; Notable for the following:    APPearance CLOUDY (*)    Hgb urine dipstick LARGE (*)    Protein, ur 100 (*)    Leukocytes, UA LARGE (*)    Bacteria, UA FEW (*)    All other components within normal limits  LIPASE, BLOOD  HCG, QUANTITATIVE, PREGNANCY  GC/CHLAMYDIA PROBE AMP ([place]) NOT AT [facility]    EKG  EKG Interpretation None       Radiology No results found.  Procedures Procedures (including critical care time)  Medications Ordered in ED Medications  cephALEXin (KEFLEX) capsule 500 mg (not administered)     Initial Impression / Assessment and Plan / ED Course  I have reviewed the triage vital signs and the nursing notes.  Pertinent labs & imaging results that were available during my care of the patient were reviewed by me and considered in my medical decision making (see chart for details).     Patient presents to the ED for dysuria.  Pelvic is unremarkable.  UA reveals an infection.  Will treat with keflex.  PCP fu advised within 3 days. She appears well and in NAD.  VS remain within her normal limits and she is safe for Dc.  Final Clinical Impressions(s) / ED Diagnoses   Final diagnoses:  Acute cystitis with hematuria    New Prescriptions New Prescriptions   CEPHALEXIN (KEFLEX) 500 MG CAPSULE    Take 1 capsule (500 mg total) by mouth 2 (two) times daily.     Tomasita Crumble, MD 05/21/16 0630

## 2016-05-21 NOTE — ED Triage Notes (Signed)
Pt complaining of urinary frequency and dysuria. Pt denies any abdominal pain. Pt denies N/V or fevers.

## 2016-05-23 LAB — GC/CHLAMYDIA PROBE AMP (~~LOC~~) NOT AT ARMC
Chlamydia: NEGATIVE
Neisseria Gonorrhea: NEGATIVE

## 2016-07-18 ENCOUNTER — Inpatient Hospital Stay (HOSPITAL_COMMUNITY)
Admission: AD | Admit: 2016-07-18 | Discharge: 2016-07-18 | Disposition: A | Discharge status: Home / Self Care | Payer: No Typology Code available for payment source | Source: Ambulatory Visit | Attending: Obstetrics & Gynecology | Admitting: Obstetrics & Gynecology

## 2016-07-18 DIAGNOSIS — Z3200 Encounter for pregnancy test, result unknown: Secondary | ICD-10-CM | POA: Insufficient documentation

## 2016-07-18 DIAGNOSIS — O209 Hemorrhage in early pregnancy, unspecified: Secondary | ICD-10-CM

## 2016-07-18 NOTE — MAU Note (Signed)
PT  SAYS  NO PAIN , NO BLEEDING- WANTS  PREG  TEST -

## 2016-07-18 NOTE — MAU Provider Note (Signed)
Faculty Practice OB/GYN Attending Note   36 y.o. G1P0010who presented to MAU today for pregnancy test, had home UP which was negative and wanted one here.  No symptoms, appears stable, no concerning symptoms. [facility].      Jaynie CollinsUGONNA  Adreyan Carbajal, MD, FACOG Attending Obstetrician & Gynecologist Faculty Practice, [facility]

## 2016-07-19 ENCOUNTER — Ambulatory Visit (INDEPENDENT_AMBULATORY_CARE_PROVIDER_SITE_OTHER): Payer: Self-pay

## 2016-07-19 ENCOUNTER — Encounter: Payer: Self-pay | Admitting: General Practice

## 2016-07-19 DIAGNOSIS — Z3200 Encounter for pregnancy test, result unknown: Secondary | ICD-10-CM

## 2016-07-19 DIAGNOSIS — Z3201 Encounter for pregnancy test, result positive: Secondary | ICD-10-CM

## 2016-07-19 LAB — POCT PREGNANCY, URINE: PREG TEST UR: POSITIVE — AB

## 2016-07-19 NOTE — Progress Notes (Signed)
Patient presented to the office today for pregnancy test. Test confirms she is pregnant around 6 weeks. Medication have been reviewed at this time. Patient advised to start taking prenatal vitamins.A letter of verification has been given to patient.

## 2016-07-20 ENCOUNTER — Inpatient Hospital Stay (HOSPITAL_COMMUNITY): Payer: Self-pay

## 2016-07-20 ENCOUNTER — Encounter (HOSPITAL_COMMUNITY): Payer: Self-pay

## 2016-07-20 ENCOUNTER — Inpatient Hospital Stay (HOSPITAL_COMMUNITY)
Admission: AD | Admit: 2016-07-20 | Discharge: 2016-07-20 | Disposition: A | Discharge status: Home / Self Care | Payer: Self-pay | Source: Ambulatory Visit | Attending: Obstetrics and Gynecology | Admitting: Obstetrics and Gynecology

## 2016-07-20 DIAGNOSIS — Z87891 Personal history of nicotine dependence: Secondary | ICD-10-CM | POA: Insufficient documentation

## 2016-07-20 DIAGNOSIS — O3411 Maternal care for benign tumor of corpus uteri, first trimester: Secondary | ICD-10-CM | POA: Insufficient documentation

## 2016-07-20 DIAGNOSIS — O99511 Diseases of the respiratory system complicating pregnancy, first trimester: Secondary | ICD-10-CM | POA: Insufficient documentation

## 2016-07-20 DIAGNOSIS — O208 Other hemorrhage in early pregnancy: Secondary | ICD-10-CM | POA: Insufficient documentation

## 2016-07-20 DIAGNOSIS — O200 Threatened abortion: Secondary | ICD-10-CM | POA: Insufficient documentation

## 2016-07-20 DIAGNOSIS — J45909 Unspecified asthma, uncomplicated: Secondary | ICD-10-CM | POA: Insufficient documentation

## 2016-07-20 DIAGNOSIS — Z3A01 Less than 8 weeks gestation of pregnancy: Secondary | ICD-10-CM | POA: Insufficient documentation

## 2016-07-20 DIAGNOSIS — Z3491 Encounter for supervision of normal pregnancy, unspecified, first trimester: Secondary | ICD-10-CM

## 2016-07-20 DIAGNOSIS — O209 Hemorrhage in early pregnancy, unspecified: Secondary | ICD-10-CM

## 2016-07-20 LAB — CBC
HEMATOCRIT: 36.2 % (ref 36.0–46.0)
HEMOGLOBIN: 12.3 g/dL (ref 12.0–15.0)
MCH: 29.5 pg (ref 26.0–34.0)
MCHC: 34 g/dL (ref 30.0–36.0)
MCV: 86.8 fL (ref 78.0–100.0)
Platelets: 257 10*3/uL (ref 150–400)
RBC: 4.17 MIL/uL (ref 3.87–5.11)
RDW: 13.5 % (ref 11.5–15.5)
WBC: 7.7 10*3/uL (ref 4.0–10.5)

## 2016-07-20 LAB — URINALYSIS, ROUTINE W REFLEX MICROSCOPIC
Bilirubin Urine: NEGATIVE
Glucose, UA: NEGATIVE mg/dL
Ketones, ur: NEGATIVE mg/dL
Leukocytes, UA: NEGATIVE
NITRITE: NEGATIVE
PH: 6 (ref 5.0–8.0)
Protein, ur: NEGATIVE mg/dL
Specific Gravity, Urine: 1.021 (ref 1.005–1.030)

## 2016-07-20 LAB — WET PREP, GENITAL
CLUE CELLS WET PREP: NONE SEEN
SPERM: NONE SEEN
Trich, Wet Prep: NONE SEEN
Yeast Wet Prep HPF POC: NONE SEEN

## 2016-07-20 LAB — HCG, QUANTITATIVE, PREGNANCY: HCG, BETA CHAIN, QUANT, S: 19999 m[IU]/mL — AB (ref <5)

## 2016-07-20 NOTE — MAU Provider Note (Signed)
Chief Complaint: Vaginal Bleeding   None     SUBJECTIVE HPI: Olivia Giles is a 36 y.o. G2P0010 at 6528w2d by LMP who presents to maternity admissions reporting vaginal bleeding with onset today.  She reports the bleeding is like menstrual bleeding, and has required 2 pads today with dark red bleeding.  She has not tried any treatments. The bleeding is unchanged since onset.  There are no associated symptoms. She denies vaginal itching/burning, urinary symptoms, h/a, dizziness, n/v, or fever/chills.     HPI  Past Medical History:  Diagnosis Date  . Asthma    No past surgical history on file. Social History   Social History  . Marital status: Single    Spouse name: N/A  . Number of children: N/A  . Years of education: N/A   Occupational History  . Not on file.   Social History Main Topics  . Smoking status: Former Games developermoker  . Smokeless tobacco: Never Used  . Alcohol use No  . Drug use: No  . Sexual activity: Yes    Birth control/ protection: None   Other Topics Concern  . Not on file   Social History Narrative  . No narrative on file   No current facility-administered medications on file prior to encounter.    No current outpatient prescriptions on file prior to encounter.   Allergies  Allergen Reactions  . Bee Venom Other (See Comments)    Swelling at site of sting     ROS:  Review of Systems  Constitutional: Negative for chills, fatigue and fever.  Respiratory: Negative for shortness of breath.   Cardiovascular: Negative for chest pain.  Gastrointestinal: Negative for nausea and vomiting.  Genitourinary: Positive for vaginal bleeding. Negative for difficulty urinating, dysuria, flank pain, pelvic pain, vaginal discharge and vaginal pain.  Neurological: Negative for dizziness and headaches.  Psychiatric/Behavioral: Negative.      I have reviewed patient's Past Medical Hx, Surgical Hx, Family Hx, Social Hx, medications and allergies.   Physical Exam    Patient Vitals for the past 24 hrs:  BP Temp Temp src Pulse Resp SpO2 Height Weight  07/20/16 2253 122/79 - - 90 16 - - -  07/20/16 2041 (!) 144/83 98.5 F (36.9 C) Oral 98 18 100 % 5' (1.524 m) 196 lb (88.9 kg)   Constitutional: Well-developed, well-nourished female in no acute distress.  Cardiovascular: normal rate Respiratory: normal effort GI: Abd soft, non-tender. Pos BS x 4 MS: Extremities nontender, no edema, normal ROM Neurologic: Alert and oriented x 4.  GU: Neg CVAT.  PELVIC EXAM: Cervix pink, visually closed, without lesion, small amount dark red/brown bleeding, vaginal walls and external genitalia normal Bimanual exam: Cervix 0/long/high, firm, anterior, neg CMT, uterus nontender, nonenlarged, adnexa without tenderness, enlargement, or mass   LAB RESULTS Results for orders placed or performed during the hospital encounter of 07/20/16 (from the past 24 hour(s))  Urinalysis, Routine w reflex microscopic     Status: Abnormal   Collection Time: 07/20/16  8:40 PM  Result Value Ref Range   Color, Urine YELLOW YELLOW   APPearance CLEAR CLEAR   Specific Gravity, Urine 1.021 1.005 - 1.030   pH 6.0 5.0 - 8.0   Glucose, UA NEGATIVE NEGATIVE mg/dL   Hgb urine dipstick MODERATE (A) NEGATIVE   Bilirubin Urine NEGATIVE NEGATIVE   Ketones, ur NEGATIVE NEGATIVE mg/dL   Protein, ur NEGATIVE NEGATIVE mg/dL   Nitrite NEGATIVE NEGATIVE   Leukocytes, UA NEGATIVE NEGATIVE   RBC / HPF  0-5 0 - 5 RBC/hpf   WBC, UA 0-5 0 - 5 WBC/hpf   Bacteria, UA RARE (A) NONE SEEN   Squamous Epithelial / LPF 0-5 (A) NONE SEEN  CBC     Status: None   Collection Time: 07/20/16  8:57 PM  Result Value Ref Range   WBC 7.7 4.0 - 10.5 K/uL   RBC 4.17 3.87 - 5.11 MIL/uL   Hemoglobin 12.3 12.0 - 15.0 g/dL   HCT 16.1 09.6 - 04.5 %   MCV 86.8 78.0 - 100.0 fL   MCH 29.5 26.0 - 34.0 pg   MCHC 34.0 30.0 - 36.0 g/dL   RDW 40.9 81.1 - 91.4 %   Platelets 257 150 - 400 K/uL  hCG, quantitative, pregnancy      Status: Abnormal   Collection Time: 07/20/16  8:57 PM  Result Value Ref Range   hCG, Beta Chain, Quant, S 19,999 (H) <5 mIU/mL  Wet prep, genital     Status: Abnormal   Collection Time: 07/20/16  9:17 PM  Result Value Ref Range   Yeast Wet Prep HPF POC NONE SEEN NONE SEEN   Trich, Wet Prep NONE SEEN NONE SEEN   Clue Cells Wet Prep HPF POC NONE SEEN NONE SEEN   WBC, Wet Prep HPF POC FEW (A) NONE SEEN   Sperm NONE SEEN        IMAGING US Ob Comp Less 14 Wks  Result Date: 07/20/2016 CLINICAL DATA:  Vaginal bleeding EXAM: OBSTETRIC <14 WK [place] AND TRANSVAGINAL OB US TECHNIQUE: Both transabdominal and transvaginal ultrasound examinations were performed for complete evaluation of the gestation as well as the maternal uterus, adnexal regions, and pelvic cul-de-sac. Transvaginal technique was performed to assess early pregnancy. COMPARISON:  None. FINDINGS: Intrauterine gestational sac: Single intrauterine gestation Yolk sac:  Visualized Embryo:  Visualized Cardiac Activity: Visualized Heart Rate: 115  bpm CRL:  4.3  mm   6 w   1 d                  [place] EDC: 03/14/2017 Subchorionic hemorrhage:  None visualized. Maternal uterus/adnexae: Ovaries are within normal limits. The left ovary measures 1.7 x 2.5 x 1.2 cm. The right ovary measures 2.6 x 3.2 x 2.4 cm. No free fluid. Fundal slightly exophytic fibroid measuring 2.7 x 2.7 x 3.1 cm IMPRESSION: 1. Single viable intrauterine pregnancy as above. 2. 3.1 cm fundal fibroid.  No other abnormalities are seen. Electronically Signed   By: Jasmine Pang M.D.   On: 07/20/2016 22:20   US Ob Transvaginal  Result Date: 07/20/2016 CLINICAL DATA:  Vaginal bleeding EXAM: OBSTETRIC <14 WK [place] AND TRANSVAGINAL OB US TECHNIQUE: Both transabdominal and transvaginal ultrasound examinations were performed for complete evaluation of the gestation as well as the maternal uterus, adnexal regions, and pelvic cul-de-sac. Transvaginal technique was performed to assess early pregnancy.  COMPARISON:  None. FINDINGS: Intrauterine gestational sac: Single intrauterine gestation Yolk sac:  Visualized Embryo:  Visualized Cardiac Activity: Visualized Heart Rate: 115  bpm CRL:  4.3  mm   6 w   1 d                  [place] EDC: 03/14/2017 Subchorionic hemorrhage:  None visualized. Maternal uterus/adnexae: Ovaries are within normal limits. The left ovary measures 1.7 x 2.5 x 1.2 cm. The right ovary measures 2.6 x 3.2 x 2.4 cm. No free fluid. Fundal slightly exophytic fibroid measuring 2.7 x 2.7 x 3.1 cm IMPRESSION: 1. Single viable  intrauterine pregnancy as above. 2. 3.1 cm fundal fibroid.  No other abnormalities are seen. Electronically Signed   By: Jasmine Pang M.D.   On: 07/20/2016 22:20    MAU Management/MDM: Ordered labs and [place] and reviewed results.  IUP confirmed with today's [place] with normal FHR.  No cause for bleeding identified.  Likely cervical bleeding related to pt physical job. Basic letter provided for pt to avoid lifting heavy objects and to take appropriate breaks related to pregnancy.  Pt to start prenatal care as planned.  Pt stable at time of discharge.  ASSESSMENT 1. Normal IUP (intrauterine pregnancy) on prenatal ultrasound, first trimester   2. Vaginal bleeding in pregnancy, first trimester   3. Threatened miscarriage in early pregnancy     PLAN Discharge home with bleeding precautions  Allergies as of 07/20/2016      Reactions   Bee Venom Other (See Comments)   Swelling at site of sting      Medication List    STOP taking these medications   cephALEXin 500 MG capsule Commonly known as:  KEFLEX   HYDROcodone-acetaminophen 5-325 MG tablet Commonly known as:  NORCO   ibuprofen 200 MG tablet Commonly known as:  ADVIL,MOTRIN   penicillin v potassium 500 MG tablet Commonly known as:  VEETID      Follow-up Information    Prenatal provider of your choice,see list provided Follow up.           Sharen Counter Certified Nurse-Midwife 07/21/2016  4:26  AM

## 2016-07-20 NOTE — MAU Note (Signed)
Pt here with c/o vaginal bleeding. Denies any pain. Just started bleeding at within the hour.

## 2016-07-21 LAB — GC/CHLAMYDIA PROBE AMP (~~LOC~~) NOT AT ARMC
CHLAMYDIA, DNA PROBE: NEGATIVE
Neisseria Gonorrhea: NEGATIVE

## 2016-07-21 LAB — HIV ANTIBODY (ROUTINE TESTING W REFLEX): HIV Screen 4th Generation wRfx: NONREACTIVE

## 2016-08-15 ENCOUNTER — Encounter (HOSPITAL_COMMUNITY): Payer: Self-pay | Admitting: *Deleted

## 2016-08-15 ENCOUNTER — Inpatient Hospital Stay (HOSPITAL_COMMUNITY)
Admission: AD | Admit: 2016-08-15 | Discharge: 2016-08-16 | Disposition: A | Discharge status: Home / Self Care | Payer: Medicaid Other | Source: Ambulatory Visit | Attending: Family Medicine | Admitting: Family Medicine

## 2016-08-15 DIAGNOSIS — Z87891 Personal history of nicotine dependence: Secondary | ICD-10-CM | POA: Insufficient documentation

## 2016-08-15 DIAGNOSIS — N939 Abnormal uterine and vaginal bleeding, unspecified: Secondary | ICD-10-CM | POA: Diagnosis present

## 2016-08-15 DIAGNOSIS — O208 Other hemorrhage in early pregnancy: Secondary | ICD-10-CM

## 2016-08-15 DIAGNOSIS — O034 Incomplete spontaneous abortion without complication: Secondary | ICD-10-CM | POA: Diagnosis not present

## 2016-08-15 DIAGNOSIS — O209 Hemorrhage in early pregnancy, unspecified: Secondary | ICD-10-CM

## 2016-08-15 LAB — CBC
HCT: 34.3 % — ABNORMAL LOW (ref 36.0–46.0)
Hemoglobin: 11.7 g/dL — ABNORMAL LOW (ref 12.0–15.0)
MCH: 29.7 pg (ref 26.0–34.0)
MCHC: 34.1 g/dL (ref 30.0–36.0)
MCV: 87.1 fL (ref 78.0–100.0)
Platelets: 254 10*3/uL (ref 150–400)
RBC: 3.94 MIL/uL (ref 3.87–5.11)
RDW: 13.9 % (ref 11.5–15.5)
WBC: 6.5 10*3/uL (ref 4.0–10.5)

## 2016-08-15 LAB — URINALYSIS, ROUTINE W REFLEX MICROSCOPIC
BILIRUBIN URINE: NEGATIVE
GLUCOSE, UA: NEGATIVE mg/dL
KETONES UR: NEGATIVE mg/dL
LEUKOCYTES UA: NEGATIVE
NITRITE: NEGATIVE
PH: 7 (ref 5.0–8.0)
Protein, ur: NEGATIVE mg/dL
SPECIFIC GRAVITY, URINE: 1.014 (ref 1.005–1.030)

## 2016-08-15 NOTE — MAU Note (Signed)
Pt states she went to the bathroom about 30 mins ago and noticed blood in toilet and on the tissue when she wiped. Pt denies pain or passing clots.

## 2016-08-16 ENCOUNTER — Inpatient Hospital Stay (HOSPITAL_COMMUNITY): Payer: Medicaid Other

## 2016-08-16 DIAGNOSIS — O208 Other hemorrhage in early pregnancy: Secondary | ICD-10-CM | POA: Diagnosis not present

## 2016-08-16 LAB — COMPREHENSIVE METABOLIC PANEL
ALBUMIN: 3.7 g/dL (ref 3.5–5.0)
ALT: 27 U/L (ref 14–54)
AST: 24 U/L (ref 15–41)
Alkaline Phosphatase: 56 U/L (ref 38–126)
Anion gap: 9 (ref 5–15)
BUN: 11 mg/dL (ref 6–20)
CHLORIDE: 103 mmol/L (ref 101–111)
CO2: 23 mmol/L (ref 22–32)
Calcium: 8.9 mg/dL (ref 8.9–10.3)
Creatinine, Ser: 0.69 mg/dL (ref 0.44–1.00)
GFR calc Af Amer: >60 mL/min (ref >60)
GLUCOSE: 92 mg/dL (ref 65–99)
POTASSIUM: 3.8 mmol/L (ref 3.5–5.1)
Sodium: 135 mmol/L (ref 135–145)
Total Bilirubin: 0.5 mg/dL (ref 0.3–1.2)
Total Protein: 8.1 g/dL (ref 6.5–8.1)

## 2016-08-16 LAB — GC/CHLAMYDIA PROBE AMP (~~LOC~~) NOT AT ARMC
Chlamydia: NEGATIVE
NEISSERIA GONORRHEA: NEGATIVE

## 2016-08-16 LAB — HCG, SERUM, QUALITATIVE: PREG SERUM: POSITIVE — AB

## 2016-08-16 LAB — WET PREP, GENITAL
Clue Cells Wet Prep HPF POC: NONE SEEN
Sperm: NONE SEEN
Trich, Wet Prep: NONE SEEN
Yeast Wet Prep HPF POC: NONE SEEN

## 2016-08-16 MED ORDER — MISOPROSTOL 200 MCG PO TABS
800.0000 ug | ORAL_TABLET | Freq: Once | ORAL | Status: AC
  Administered 2016-08-16: 800 ug via VAGINAL
  Filled 2016-08-16: qty 4

## 2016-08-16 MED ORDER — PROMETHAZINE HCL 12.5 MG PO TABS: 12.5000 mg | ORAL_TABLET | Freq: Four times a day (QID) | ORAL | 0 refills | Status: DC | PRN

## 2016-08-16 MED ORDER — IBUPROFEN 800 MG PO TABS: 800.0000 mg | ORAL_TABLET | Freq: Three times a day (TID) | ORAL | 0 refills | Status: DC | PRN

## 2016-08-16 MED ORDER — ACETAMINOPHEN-CODEINE #3 300-30 MG PO TABS: 2.0000 | ORAL_TABLET | Freq: Four times a day (QID) | ORAL | 0 refills | Status: DC | PRN

## 2016-08-16 NOTE — MAU Provider Note (Signed)
History     CSN: [id number]  Arrival date and time: 08/15/16 2240   First Provider Initiated Contact with Patient 08/15/16 2329      Chief Complaint  Patient presents with  . Vaginal Bleeding   HPI  [name] is a 36 yo G2P0010 presenting with vaginal bleeding since 22:00. She awoke to pee and noticed some blood when she wiped, and looked down and noticed some blood in the toilet bowl too. She has not had any associated cramping/pain. She describes the bleeding as light. She denies trauma, heavy lifting, or intercourse in the last 3 days. She reports a history of fibroids, and states that this bleeding is different than the bleeding she had from her previous degenerating fibroid. She also has a history of a previous miscarriage at 7 weeks and reports that her bleeding today is different that the bleeding she had then.   Past Medical History:  Diagnosis Date  . Asthma     History reviewed. No pertinent surgical history.  History reviewed. No pertinent family history.  Social History  Substance Use Topics  . Smoking status: Former Games developer  . Smokeless tobacco: Never Used  . Alcohol use No    Allergies:  Allergies  Allergen Reactions  . Bee Venom Other (See Comments)    Swelling at site of sting     No prescriptions prior to admission.    Review of Systems  Constitutional: Negative.   HENT: Negative.   Eyes: Negative.   Respiratory: Negative.   Cardiovascular: Negative.   Gastrointestinal: Negative.   Endocrine: Negative.   Genitourinary: Positive for vaginal bleeding (very light). Negative for pelvic pain.  Musculoskeletal: Negative.   Skin: Negative.   Allergic/Immunologic: Negative.   Neurological: Negative.   Hematological: Negative.   Psychiatric/Behavioral: Negative.    Physical Exam   Blood pressure 139/86, pulse 86, temperature 99.4 F (37.4 C), temperature source Oral, resp. rate 18, height 5\' 1"  (1.549 m), weight 91.2 kg (201 lb), last  menstrual period 06/06/2016, SpO2 99 %.  Physical Exam  Constitutional: She is oriented to person, place, and time. She appears well-developed and well-nourished.  HENT:  Head: Normocephalic.  Eyes: Pupils are equal, round, and reactive to light.  Neck: Normal range of motion.  Cardiovascular: Normal rate, regular rhythm and normal heart sounds.   Respiratory: Effort normal and breath sounds normal.  GI: Soft. Bowel sounds are normal.  Genitourinary:  Genitourinary Comments: Uterus: non-tender, enlarged, cx; smooth, pink, no lesions, scant amt of red blood and 1 medium clot, closed/long/firm, no CMT or friability, no adnexal tenderness   Musculoskeletal: Normal range of motion.  Neurological: She is alert and oriented to person, place, and time. She has normal reflexes.  Skin: Skin is warm and dry.  Psychiatric: She has a normal mood and affect. Her behavior is normal. Judgment and thought content normal.    MAU Course  Procedures  MDM CCUA CBC CMP OB Limited U/S Cytotec 800 mcg pv prior to d/c   Results for orders placed or performed during the hospital encounter of 08/15/16 (from the past 24 hour(s))  Urinalysis, Routine w reflex microscopic     Status: Abnormal   Collection Time: 08/15/16 10:58 PM  Result Value Ref Range   Color, Urine YELLOW YELLOW   APPearance CLEAR CLEAR   Specific Gravity, Urine 1.014 1.005 - 1.030   pH 7.0 5.0 - 8.0   Glucose, UA NEGATIVE NEGATIVE mg/dL   Hgb urine dipstick LARGE (A) NEGATIVE  Bilirubin Urine NEGATIVE NEGATIVE   Ketones, ur NEGATIVE NEGATIVE mg/dL   Protein, ur NEGATIVE NEGATIVE mg/dL   Nitrite NEGATIVE NEGATIVE   Leukocytes, UA NEGATIVE NEGATIVE   RBC / HPF 6-30 0 - 5 RBC/hpf   WBC, UA 0-5 0 - 5 WBC/hpf   Bacteria, UA RARE (A) NONE SEEN   Squamous Epithelial / LPF 0-5 (A) NONE SEEN  Wet prep, genital     Status: Abnormal   Collection Time: 08/15/16 11:50 PM  Result Value Ref Range   Yeast Wet Prep HPF POC NONE SEEN NONE  SEEN   Trich, Wet Prep NONE SEEN NONE SEEN   Clue Cells Wet Prep HPF POC NONE SEEN NONE SEEN   WBC, Wet Prep HPF POC FEW (A) NONE SEEN   Sperm NONE SEEN   CBC     Status: Abnormal   Collection Time: 08/15/16 11:51 PM  Result Value Ref Range   WBC 6.5 4.0 - 10.5 K/uL   RBC 3.94 3.87 - 5.11 MIL/uL   Hemoglobin 11.7 (L) 12.0 - 15.0 g/dL   HCT 16.1 (L) 09.6 - 04.5 %   MCV 87.1 78.0 - 100.0 fL   MCH 29.7 26.0 - 34.0 pg   MCHC 34.1 30.0 - 36.0 g/dL   RDW 40.9 81.1 - 91.4 %   Platelets 254 150 - 400 K/uL   US Ob Transvaginal  Result Date: 08/16/2016 CLINICAL DATA:  Bleeding in early pregnancy EXAM: TRANSVAGINAL OB ULTRASOUND TECHNIQUE: Transvaginal ultrasound was performed for complete evaluation of the gestation as well as the maternal uterus, adnexal regions, and pelvic cul-de-sac. COMPARISON:  None. FINDINGS: Intrauterine gestational sac: Single, mildly irregular. Yolk sac:  Not visible Embryo:  Visible Cardiac Activity: Not visible Heart Rate:  bpm MSD:   mm    w     d CRL:   6  mm   6 w 2 d                  [place] EDC: 04/09/2017 Subchorionic hemorrhage:  Small Maternal uterus/adnexae: Normal ovaries. 4 cm posterior uterine fibroid. No abnormal pelvic fluid collection. IMPRESSION: Single intrauterine gestation measuring 6 weeks 2 days, with no visible cardiac activity. Findings are suspicious for failed pregnancy. Recommend follow-up US in 10-14 days for definitive diagnosis. This recommendation follows SRU consensus guidelines: Diagnostic Criteria for Nonviable Pregnancy Early in the First Trimester. Malva Limes Med 2013; 782:9562-13. Electronically Signed   By: Ellery Plunk M.D.   On: 08/16/2016 00:53   *Consult with Dr. Shawnie Pons - ok to initiate Cytotec protocol   Early Intrauterine Pregnancy Failure Protocol X  Documented intrauterine pregnancy failure less than or equal to [redacted] weeks   gestation  X  No serious current illness  X  Baseline Hgb greater than or equal to 10g/dl  X  Patient has  easily accessible transportation to the hospital  X  Clear preference  X  Practitioner/physician deems patient reliable  X  Counseling by practitioner or physician  X  Patient education by RN       Rho-Gam given by RN if indicated  X  Medication dispensed  _  Cytotec 800 mcg Intravaginally by patient at home   X   Intravaginally by RN in MAU       Rectally by patient at home       Rectally by RN in MAU  X   Ibuprofen 600 mg 1 tablet by mouth every 6 hours as needed #30 - prescribed  X   Tylenol #3 mg by mouth every 4 to 6 hours as needed - prescribed  X   Phenergan 12.5 mg by mouth every 4 hours as needed for nausea - prescribed  Reviewed with pt cytotec procedure.  Pt verbalizes that she lives close to the hospital and has transportation readily available.  Pt appears reliable and verbalizes understanding and agrees with plan of care   Assessment and Plan  Vaginal bleeding affecting early pregnancy  Failed Intrauterine Pregnancy - Cytotec Protocol started - Instructions on incomplete miscarriage given - Follow-up with [facility] in 2 wks  Allergies as of 08/16/2016      Reactions   Bee Venom Other (See Comments)   Swelling at site of sting      Medication List    TAKE these medications   acetaminophen-codeine 300-30 MG tablet Commonly known as:  TYLENOL #3 Take 2 tablets by mouth every 6 (six) hours as needed for moderate pain.   ibuprofen 800 MG tablet Commonly known as:  ADVIL,MOTRIN Take 1 tablet (800 mg total) by mouth every 8 (eight) hours as needed for mild pain or moderate pain.   promethazine 12.5 MG tablet Commonly known as:  PHENERGAN Take 1 tablet (12.5 mg total) by mouth every 6 (six) hours as needed for nausea or vomiting.       Raelyn Moraolitta Kaya Klausing, MSN, CNM 08/16/2016, 11:25 PM

## 2016-08-17 LAB — HIV ANTIBODY (ROUTINE TESTING W REFLEX): HIV Screen 4th Generation wRfx: NONREACTIVE

## 2016-08-21 ENCOUNTER — Inpatient Hospital Stay (HOSPITAL_COMMUNITY)
Admission: AD | Admit: 2016-08-21 | Discharge: 2016-08-21 | Disposition: A | Discharge status: Home / Self Care | Payer: Medicaid Other | Source: Ambulatory Visit | Attending: Obstetrics and Gynecology | Admitting: Obstetrics and Gynecology

## 2016-08-21 ENCOUNTER — Encounter (HOSPITAL_COMMUNITY): Payer: Self-pay

## 2016-08-21 DIAGNOSIS — N939 Abnormal uterine and vaginal bleeding, unspecified: Secondary | ICD-10-CM | POA: Diagnosis present

## 2016-08-21 DIAGNOSIS — Z6790 Unspecified blood type, Rh positive: Secondary | ICD-10-CM | POA: Insufficient documentation

## 2016-08-21 DIAGNOSIS — O039 Complete or unspecified spontaneous abortion without complication: Secondary | ICD-10-CM | POA: Insufficient documentation

## 2016-08-21 DIAGNOSIS — Z87891 Personal history of nicotine dependence: Secondary | ICD-10-CM | POA: Diagnosis not present

## 2016-08-21 DIAGNOSIS — Z8249 Family history of ischemic heart disease and other diseases of the circulatory system: Secondary | ICD-10-CM | POA: Insufficient documentation

## 2016-08-21 HISTORY — DX: Bronchitis, not specified as acute or chronic: J40

## 2016-08-21 LAB — CBC
HEMATOCRIT: 35 % — AB (ref 36.0–46.0)
Hemoglobin: 11.8 g/dL — ABNORMAL LOW (ref 12.0–15.0)
MCH: 29.6 pg (ref 26.0–34.0)
MCHC: 33.7 g/dL (ref 30.0–36.0)
MCV: 87.7 fL (ref 78.0–100.0)
Platelets: 232 10*3/uL (ref 150–400)
RBC: 3.99 MIL/uL (ref 3.87–5.11)
RDW: 13.7 % (ref 11.5–15.5)
WBC: 9.9 10*3/uL (ref 4.0–10.5)

## 2016-08-21 MED ORDER — OXYCODONE-ACETAMINOPHEN 5-325 MG PO TABS
2.0000 | ORAL_TABLET | Freq: Once | ORAL | Status: AC
  Administered 2016-08-21: 2 via ORAL
  Filled 2016-08-21: qty 2

## 2016-08-21 MED ORDER — MISOPROSTOL 200 MCG PO TABS
800.0000 ug | ORAL_TABLET | Freq: Once | ORAL | Status: AC
  Administered 2016-08-21: 800 ug via ORAL
  Filled 2016-08-21: qty 4

## 2016-08-21 NOTE — Discharge Instructions (Signed)

## 2016-08-21 NOTE — MAU Provider Note (Signed)
History     CSN: [id number]  Arrival date and time: 08/21/16 1347   First Provider Initiated Contact with Patient 08/21/16 1422      Chief Complaint  Patient presents with  . Vaginal Bleeding   G2P0010 @[redacted]w[redacted]d  here with VB. Bleeding started around 3am. Reports soaking pads in less than 1 hr. Passing clots. Endorses lower abdominal cramping. Used Ibuprofen and had no relief. She was seen 6 days ago and dx with failed pregnancy, and given Cytotec. She had minimal bleeding and pain until today.   OB History    Gravida Para Term Preterm AB Living   2 0     1     SAB TAB Ectopic Multiple Live Births   1              Past Medical History:  Diagnosis Date  . Asthma   . Bronchitis     Past Surgical History:  Procedure Laterality Date  . NO PAST SURGERIES      Family History  Problem Relation Age of Onset  . Hypertension Mother   . Hypertension Father   . Hypertension Sister     Social History  Substance Use Topics  . Smoking status: Former Games developermoker  . Smokeless tobacco: Never Used  . Alcohol use No    Allergies:  Allergies  Allergen Reactions  . Bee Venom Swelling and Other (See Comments)    Reaction:  Localized swelling    Prescriptions Prior to Admission  Medication Sig Dispense Refill Last Dose  . ibuprofen (ADVIL,MOTRIN) 800 MG tablet Take 1 tablet (800 mg total) by mouth every 8 (eight) hours as needed for mild pain or moderate pain. 30 tablet 0 08/21/2016 at 1300  . promethazine (PHENERGAN) 12.5 MG tablet Take 1 tablet (12.5 mg total) by mouth every 6 (six) hours as needed for nausea or vomiting. 30 tablet 0 08/21/2016 at Unknown time  . acetaminophen-codeine (TYLENOL #3) 300-30 MG tablet Take 2 tablets by mouth every 6 (six) hours as needed for moderate pain. 30 tablet 0     Review of Systems  Gastrointestinal: Positive for abdominal pain.  Genitourinary: Positive for vaginal bleeding.   Physical Exam   Blood pressure (!) 118/54, pulse (!) 105,  temperature 97.9 F (36.6 C), temperature source Oral, resp. rate 20, last menstrual period 06/06/2016, SpO2 100 %.  Physical Exam  Constitutional: She is oriented to person, place, and time. She appears well-developed and well-nourished. No distress.  HENT:  Head: Normocephalic and atraumatic.  Neck: Normal range of motion.  Respiratory: Effort normal.  Genitourinary:  Genitourinary Comments: External: no lesions or erythema Vagina: rugated, pink, moist, large blood in vault, POCs protruding from os- grasped with ring forcep and removed, sac intact with fluid inside, moderate bleeding from os after   Musculoskeletal: Normal range of motion.  Neurological: She is alert and oriented to person, place, and time.  Skin: Skin is warm and dry.  Psychiatric: She has a normal mood and affect.   Results for orders placed or performed during the hospital encounter of 08/21/16 (from the past 24 hour(s))  CBC     Status: Abnormal   Collection Time: 08/21/16  2:39 PM  Result Value Ref Range   WBC 9.9 4.0 - 10.5 K/uL   RBC 3.99 3.87 - 5.11 MIL/uL   Hemoglobin 11.8 (L) 12.0 - 15.0 g/dL   HCT 40.935.0 (L) 81.136.0 - 91.446.0 %   MCV 87.7 78.0 - 100.0 fL   MCH 29.6 26.0 -  34.0 pg   MCHC 33.7 30.0 - 36.0 g/dL   RDW 13.013.7 86.511.5 - 78.415.5 %   Platelets 232 150 - 400 K/uL   MAU Course  Procedures Percocet Cytotec  MDM Labs ordered and reviewed. Pain improved, bleeding minimal. POCs to pathology. Stable for discharge home.   Assessment and Plan   1. Complete abortion   2. Blood type, Rh positive    [facility] Bleeding/return precautions  Allergies as of 08/21/2016      Reactions   Bee Venom Swelling, Other (See Comments)   Reaction:  Localized swelling      Medication List    TAKE these medications   acetaminophen-codeine 300-30 MG tablet Commonly known as:  TYLENOL #3 Take 2 tablets by mouth every 6 (six) hours as needed for moderate pain.   ibuprofen 800 MG  tablet Commonly known as:  ADVIL,MOTRIN Take 1 tablet (800 mg total) by mouth every 8 (eight) hours as needed for mild pain or moderate pain.   promethazine 12.5 MG tablet Commonly known as:  PHENERGAN Take 1 tablet (12.5 mg total) by mouth every 6 (six) hours as needed for nausea or vomiting.      Donette LarryMelanie Zahava Quant, CNM 08/21/2016, 2:39 PM

## 2016-08-21 NOTE — MAU Note (Signed)
Bleeding started this  Morning.  Was dx with a miscarriage on 7/23, was given some pills to help pass it.  The pain medication is not helping. Saturating < 30 min, passing big clots.

## 2016-09-01 ENCOUNTER — Encounter: Payer: Self-pay | Admitting: Advanced Practice Midwife

## 2016-09-01 ENCOUNTER — Ambulatory Visit (INDEPENDENT_AMBULATORY_CARE_PROVIDER_SITE_OTHER): Payer: Medicaid Other | Admitting: Advanced Practice Midwife

## 2016-09-01 VITALS — BP 113/80 | HR 112 | Wt 201.1 lb

## 2016-09-01 DIAGNOSIS — O039 Complete or unspecified spontaneous abortion without complication: Secondary | ICD-10-CM

## 2016-09-01 NOTE — Progress Notes (Signed)
Patient seen in MAU 7/29, complete abortion. No complaints.

## 2016-09-01 NOTE — Progress Notes (Signed)
   Subjective:    Patient ID: Olivia Giles, female    DOB: 02/26/1980, 36 y.o.   MRN: [id number]  Sharen HonesHis is a 36 y.o. female who presents for followup after SAB on 7/29. Passed tissue in MAU after cytotec Has not bled much since then This is her second SAB, wants to conceive Vaginal Bleeding  The patient's pertinent negatives include no genital itching, genital lesions, genital odor, pelvic pain, vaginal bleeding or vaginal discharge. The patient is experiencing no pain. She is not pregnant. Pertinent negatives include no back pain, constipation, diarrhea, fever, flank pain, nausea or vomiting. Nothing aggravates the symptoms. She has tried nothing for the symptoms. She uses nothing for contraception.     Review of Systems  Constitutional: Negative for fatigue and fever.  Respiratory: Negative for shortness of breath.   Gastrointestinal: Negative for constipation, diarrhea, nausea and vomiting.  Genitourinary: Positive for vaginal bleeding. Negative for difficulty urinating, flank pain, pelvic pain and vaginal discharge.  Musculoskeletal: Negative for back pain.  Neurological: Negative for dizziness.       Objective:   Physical Exam  Constitutional: She is oriented to person, place, and time. She appears well-developed and well-nourished. No distress.  HENT:  Head: Normocephalic.  Cardiovascular: Normal rate and regular rhythm.   Pulmonary/Chest: Effort normal and breath sounds normal. No respiratory distress.  Abdominal: Soft. She exhibits no distension and no mass. There is no tenderness. There is no rebound and no guarding.  Genitourinary:  Genitourinary Comments: Pelvic deferred  Musculoskeletal: Normal range of motion.  Neurological: She is alert and oriented to person, place, and time.  Skin: Skin is warm and dry.  Psychiatric: She has a normal mood and affect.          Assessment & Plan:  S/P SAB  Doing well physically but sad Wants to conceive again Discussed  timing of conception Recommend continue prenatal vitamins Will check Quant HCG today and add TSH/T3/T4  Close followup when pregnant

## 2016-09-01 NOTE — Patient Instructions (Signed)

## 2016-09-02 LAB — T4, FREE: Free T4: 1.3 ng/dL (ref 0.82–1.77)

## 2016-09-02 LAB — TSH: TSH: 0.642 u[IU]/mL (ref 0.450–4.500)

## 2016-09-02 LAB — BETA HCG QUANT (REF LAB): hCG Quant: 19 m[IU]/mL

## 2016-09-02 LAB — T3, FREE: T3, Free: 3.2 pg/mL (ref 2.0–4.4)

## 2016-09-07 ENCOUNTER — Telehealth: Payer: Self-pay | Admitting: *Deleted

## 2016-09-07 NOTE — Telephone Encounter (Signed)
-----   Message from Aviva SignsMarie L Williams, CNM sent at 09/03/2016 12:25 AM EDT ----- Regarding: ? wrong pt on last message, cancel message on [place] Think I sent wrong message to Johna Sheriffrent  This is the lady who had a miscarriage  HCG was 19 on the 9th,  Needs repeat around 16th, does not have to wait  Tell her her Thyroid tests were normal  Hilda LiasMarie

## 2016-09-07 NOTE — Telephone Encounter (Signed)
Called patient with test results. Informed patient of need to return to clinic for another blood draw. Patient prefers to come in Monday, 8/20 in the afternoon. Routing this message to front desk for scheduling, patient aware she will be called with a time.

## 2016-09-12 ENCOUNTER — Other Ambulatory Visit: Payer: Medicaid Other

## 2016-09-12 DIAGNOSIS — O0990 Supervision of high risk pregnancy, unspecified, unspecified trimester: Secondary | ICD-10-CM

## 2016-09-13 LAB — HCG, SERUM, QUALITATIVE: hCG,Beta Subunit,Qual,Serum: NEGATIVE m[IU]/mL (ref <6)

## 2016-09-21 ENCOUNTER — Telehealth: Payer: Self-pay | Admitting: General Practice

## 2016-09-21 NOTE — Telephone Encounter (Signed)
Patient called and left message stating she was here last Monday and had labs. Patient is calling for results. Called patient & informed her of negative results. Patient verbalized understanding and had no questions

## 2017-12-22 ENCOUNTER — Other Ambulatory Visit: Payer: Self-pay

## 2017-12-22 ENCOUNTER — Encounter (HOSPITAL_COMMUNITY): Payer: Self-pay | Admitting: Emergency Medicine

## 2017-12-22 ENCOUNTER — Inpatient Hospital Stay (HOSPITAL_COMMUNITY)
Admission: AD | Admit: 2017-12-22 | Discharge: 2017-12-22 | Disposition: A | Discharge status: Home / Self Care | Payer: Self-pay | Source: Ambulatory Visit | Attending: Obstetrics & Gynecology | Admitting: Obstetrics & Gynecology

## 2017-12-22 DIAGNOSIS — N898 Other specified noninflammatory disorders of vagina: Secondary | ICD-10-CM | POA: Insufficient documentation

## 2017-12-22 DIAGNOSIS — Z791 Long term (current) use of non-steroidal anti-inflammatories (NSAID): Secondary | ICD-10-CM | POA: Insufficient documentation

## 2017-12-22 DIAGNOSIS — Z87891 Personal history of nicotine dependence: Secondary | ICD-10-CM | POA: Insufficient documentation

## 2017-12-22 LAB — URINALYSIS, ROUTINE W REFLEX MICROSCOPIC
Bacteria, UA: NONE SEEN
Bilirubin Urine: NEGATIVE
Glucose, UA: NEGATIVE mg/dL
Hgb urine dipstick: NEGATIVE
Ketones, ur: NEGATIVE mg/dL
Nitrite: NEGATIVE
PH: 6 (ref 5.0–8.0)
Protein, ur: NEGATIVE mg/dL
Specific Gravity, Urine: 1.018 (ref 1.005–1.030)

## 2017-12-22 LAB — WET PREP, GENITAL
Clue Cells Wet Prep HPF POC: NONE SEEN
Sperm: NONE SEEN
TRICH WET PREP: NONE SEEN
WBC, Wet Prep HPF POC: NONE SEEN
YEAST WET PREP: NONE SEEN

## 2017-12-22 LAB — POCT PREGNANCY, URINE: Preg Test, Ur: NEGATIVE

## 2017-12-22 MED ORDER — FLUCONAZOLE 150 MG PO TABS: 150.0000 mg | ORAL_TABLET | Freq: Once | ORAL | 0 refills | Status: AC

## 2017-12-22 MED ORDER — CLOTRIMAZOLE-BETAMETHASONE 1-0.05 % EX CREA: TOPICAL_CREAM | CUTANEOUS | 0 refills | Status: DC

## 2017-12-22 NOTE — Discharge Instructions (Signed)

## 2017-12-22 NOTE — MAU Note (Signed)
Pt arrives to mau with vag discomfort with tenderness and itching. Pt co vag dc that white and clumpy. Pt co of burning when she urinates that started yesterday 12/21/2017. Denies vag bleeding. No hx of HSV.

## 2017-12-22 NOTE — MAU Provider Note (Signed)
History     CSN: [id number]  Arrival date and time: 12/22/17 1211   First Provider Initiated Contact with Patient 12/22/17 1337      Chief Complaint  Patient presents with  . Vaginal Discharge   HPI   Olivia Giles is a 37 y.o. female G2P0020 here in MAU with complaints with vaginal irritation, and white clumpy discharge. Symptoms started 3 days ago.  No pain, no bleeding. Says the itching and irritation is worse on the outer part of her vagina. Patient is married.   OB History    Gravida  2   Para  0   Term      Preterm      AB  2   Living        SAB  2   TAB      Ectopic      Multiple      Live Births              Past Medical History:  Diagnosis Date  . Asthma   . Bronchitis     Past Surgical History:  Procedure Laterality Date  . NO PAST SURGERIES      Family History  Problem Relation Age of Onset  . Hypertension Mother   . Hypertension Father   . Hypertension Sister     Social History   Tobacco Use  . Smoking status: Former Games developermoker  . Smokeless tobacco: Never Used  Substance Use Topics  . Alcohol use: No  . Drug use: No    Allergies:  Allergies  Allergen Reactions  . Bee Venom Swelling and Other (See Comments)    Reaction:  Localized swelling    Medications Prior to Admission  Medication Sig Dispense Refill Last Dose  . acetaminophen-codeine (TYLENOL #3) 300-30 MG tablet Take 2 tablets by mouth every 6 (six) hours as needed for moderate pain. (Patient not taking: Reported on 09/01/2016) 30 tablet 0 Not Taking  . ibuprofen (ADVIL,MOTRIN) 800 MG tablet Take 1 tablet (800 mg total) by mouth every 8 (eight) hours as needed for mild pain or moderate pain. 30 tablet 0 Taking  . promethazine (PHENERGAN) 12.5 MG tablet Take 1 tablet (12.5 mg total) by mouth every 6 (six) hours as needed for nausea or vomiting. (Patient not taking: Reported on 09/01/2016) 30 tablet 0 Not Taking   Results for orders placed or performed during the  hospital encounter of 12/22/17 (from the past 48 hour(s))  Urinalysis, Routine w reflex microscopic     Status: Abnormal   Collection Time: 12/22/17 12:39 PM  Result Value Ref Range   Color, Urine YELLOW YELLOW   APPearance CLEAR CLEAR   Specific Gravity, Urine 1.018 1.005 - 1.030   pH 6.0 5.0 - 8.0   Glucose, UA NEGATIVE NEGATIVE mg/dL   Hgb urine dipstick NEGATIVE NEGATIVE   Bilirubin Urine NEGATIVE NEGATIVE   Ketones, ur NEGATIVE NEGATIVE mg/dL   Protein, ur NEGATIVE NEGATIVE mg/dL   Nitrite NEGATIVE NEGATIVE   Leukocytes, UA SMALL (A) NEGATIVE   RBC / HPF 0-5 0 - 5 RBC/hpf   WBC, UA 0-5 0 - 5 WBC/hpf   Bacteria, UA NONE SEEN NONE SEEN   Squamous Epithelial / LPF 0-5 0 - 5   Mucus PRESENT     Comment: Performed at [facility], [address] [place], [place] [id number]  Pregnancy, urine POC     Status: None   Collection Time: 12/22/17 12:42 PM  Result Value Ref Range  Preg Test, Ur NEGATIVE NEGATIVE    Comment:        THE SENSITIVITY OF THIS METHODOLOGY IS >24 mIU/mL   Wet prep, genital     Status: None   Collection Time: 12/22/17  1:13 PM  Result Value Ref Range   Yeast Wet Prep HPF POC NONE SEEN NONE SEEN   Trich, Wet Prep NONE SEEN NONE SEEN   Clue Cells Wet Prep HPF POC NONE SEEN NONE SEEN   WBC, Wet Prep HPF POC NONE SEEN NONE SEEN    Comment: FEW BACTERIA SEEN   Sperm NONE SEEN     Comment: Performed at [facility], [address], [place], [place] [address]   Review of Systems  Constitutional: Negative for fever.  Gastrointestinal: Negative for abdominal pain.   Physical Exam   Blood pressure 135/85, pulse 85, temperature 98.2 F (36.8 C), temperature source Oral, resp. rate 17, height 5' (1.524 m), weight 97.1 kg, SpO2 99 %.  Physical Exam  Constitutional: She is oriented to person, place, and time. She appears well-developed and well-nourished. No distress.  HENT:  Head: Normocephalic.  Eyes: Pupils are equal, round, and reactive to light.   Genitourinary:  Genitourinary Comments: Wet prep and GC collected without speculum   Musculoskeletal: Normal range of motion.  Neurological: She is alert and oriented to person, place, and time.  Skin: She is not diaphoretic.   MAU Course  Procedures  None  MDM  Wet prep and GC HIV collected   Assessment and Plan   A:  1. Vaginal irritation   2. Vaginal itching   3. Vaginal discharge   4.      Presumed yeast   P:   Discharge home in stable condition Rx: Diflucan, Clotrimazole  Discussed the importance of GYN care, encouraged patient to call and schedule an appointment for Pap and annual.   Duane Lope, NP 12/22/2017 6:05 PM

## 2017-12-23 LAB — HIV ANTIBODY (ROUTINE TESTING W REFLEX): HIV SCREEN 4TH GENERATION: NONREACTIVE

## 2017-12-25 LAB — GC/CHLAMYDIA PROBE AMP (~~LOC~~) NOT AT ARMC
Chlamydia: NEGATIVE
Neisseria Gonorrhea: NEGATIVE

## 2018-01-29 ENCOUNTER — Encounter: Payer: Self-pay | Admitting: Advanced Practice Midwife

## 2018-01-29 ENCOUNTER — Ambulatory Visit (INDEPENDENT_AMBULATORY_CARE_PROVIDER_SITE_OTHER): Payer: Self-pay | Admitting: Advanced Practice Midwife

## 2018-01-29 VITALS — BP 121/76 | HR 84 | Wt 218.0 lb

## 2018-01-29 DIAGNOSIS — Z113 Encounter for screening for infections with a predominantly sexual mode of transmission: Secondary | ICD-10-CM

## 2018-01-29 DIAGNOSIS — Z1151 Encounter for screening for human papillomavirus (HPV): Secondary | ICD-10-CM

## 2018-01-29 DIAGNOSIS — Z124 Encounter for screening for malignant neoplasm of cervix: Secondary | ICD-10-CM

## 2018-01-29 DIAGNOSIS — B9689 Other specified bacterial agents as the cause of diseases classified elsewhere: Secondary | ICD-10-CM

## 2018-01-29 DIAGNOSIS — Z01419 Encounter for gynecological examination (general) (routine) without abnormal findings: Secondary | ICD-10-CM

## 2018-01-29 DIAGNOSIS — N898 Other specified noninflammatory disorders of vagina: Secondary | ICD-10-CM

## 2018-01-29 DIAGNOSIS — N760 Acute vaginitis: Secondary | ICD-10-CM

## 2018-01-29 NOTE — Progress Notes (Signed)
GYNECOLOGY ANNUAL PREVENTATIVE CARE ENCOUNTER NOTE  Subjective:   Olivia Giles is a 38 y.o. G83P0020 female here for a routine annual gynecologic exam.  Current complaints: still having irritation after MAU visit on 12/22/17. Yeast treatment did help for a day or two, but sx have returned.   Denies abnormal vaginal bleeding, discharge, pelvic pain, problems with intercourse or other gynecologic concerns.    Gynecologic History Patient's last menstrual period was 01/20/2018 (exact date). Contraception: none and not planning, but not preventing a pregnancy. Patient would like to become pregnant Last Pap: unsure. Results were: unsure  Last mammogram: None, age. Results were: NA  Obstetric History OB History  Gravida Para Term Preterm AB Living  2 0     2    SAB TAB Ectopic Multiple Live Births  2            # Outcome Date GA Lbr Len/2nd Weight Sex Delivery Anes PTL Lv  2 SAB  [redacted]w[redacted]d         1 SAB  [redacted]w[redacted]d           Past Medical History:  Diagnosis Date  . Asthma   . Bronchitis     Past Surgical History:  Procedure Laterality Date  . NO PAST SURGERIES      Current Outpatient Medications on File Prior to Visit  Medication Sig Dispense Refill  . acetaminophen-codeine (TYLENOL #3) 300-30 MG tablet Take 2 tablets by mouth every 6 (six) hours as needed for moderate pain. (Patient not taking: Reported on 09/01/2016) 30 tablet 0  . clotrimazole-betamethasone (LOTRISONE) cream Apply to affected area 2 times daily prn (Patient not taking: Reported on 01/29/2018) 15 g 0  . ibuprofen (ADVIL,MOTRIN) 800 MG tablet Take 1 tablet (800 mg total) by mouth every 8 (eight) hours as needed for mild pain or moderate pain. (Patient not taking: Reported on 01/29/2018) 30 tablet 0  . promethazine (PHENERGAN) 12.5 MG tablet Take 1 tablet (12.5 mg total) by mouth every 6 (six) hours as needed for nausea or vomiting. (Patient not taking: Reported on 09/01/2016) 30 tablet 0   No current facility-administered  medications on file prior to visit.     Allergies  Allergen Reactions  . Bee Venom Swelling and Other (See Comments)    Reaction:  Localized swelling    Social History   Socioeconomic History  . Marital status: Married    Spouse name: Not on file  . Number of children: Not on file  . Years of education: Not on file  . Highest education level: Not on file  Occupational History  . Not on file  Social Needs  . Financial resource strain: Not on file  . Food insecurity:    Worry: Not on file    Inability: Not on file  . Transportation needs:    Medical: Not on file    Non-medical: Not on file  Tobacco Use  . Smoking status: Former Games developer  . Smokeless tobacco: Never Used  Substance and Sexual Activity  . Alcohol use: No  . Drug use: No  . Sexual activity: Yes    Birth control/protection: None  Lifestyle  . Physical activity:    Days per week: Not on file    Minutes per session: Not on file  . Stress: Not on file  Relationships  . Social connections:    Talks on phone: Not on file    Gets together: Not on file    Attends religious service: Not on file  Active member of club or organization: Not on file    Attends meetings of clubs or organizations: Not on file    Relationship status: Not on file  . Intimate partner violence:    Fear of current or ex partner: Not on file    Emotionally abused: Not on file    Physically abused: Not on file    Forced sexual activity: Not on file  Other Topics Concern  . Not on file  Social History Narrative  . Not on file    Family History  Problem Relation Age of Onset  . Hypertension Mother   . Hypertension Father   . Hypertension Sister     The following portions of the patient's history were reviewed and updated as appropriate: allergies, current medications, past family history, past medical history, past social history, past surgical history and problem list.  Review of Systems Pertinent items noted in HPI and  remainder of comprehensive ROS otherwise negative.   Objective:  BP 121/76   Pulse 84   Wt 218 lb (98.9 kg)   LMP 01/20/2018 (Exact Date)   BMI 42.58 kg/m  CONSTITUTIONAL: Well-developed, well-nourished female in no acute distress.  HENT:  Normocephalic, atraumatic, External right and left ear normal. Oropharynx is clear and moist EYES: Conjunctivae and EOM are normal. Pupils are equal, round, and reactive to light. No scleral icterus.  NECK: Normal range of motion, supple, no masses.  Normal thyroid.  SKIN: Skin is warm and dry. No rash noted. Not diaphoretic. No erythema. No pallor. NEUROLOGIC: Alert and oriented to person, place, and time. Normal reflexes, muscle tone coordination. No cranial nerve deficit noted. PSYCHIATRIC: Normal mood and affect. Normal behavior. Normal judgment and thought content. CARDIOVASCULAR: Normal heart rate noted, regular rhythm RESPIRATORY: Clear to auscultation bilaterally. Effort and breath sounds normal, no problems with respiration noted. BREASTS: Symmetric in size. No masses, skin changes, nipple drainage, or lymphadenopathy. ABDOMEN: Soft, normal bowel sounds, no distention noted.  No tenderness, rebound or guarding.  PELVIC: Normal appearing external genitalia; normal appearing vaginal mucosa and cervix.  No abnormal discharge noted.  Pap smear obtained.  Normal uterine size, no other palpable masses, no uterine or adnexal tenderness. MUSCULOSKELETAL: Normal range of motion. No tenderness.  No cyanosis, clubbing, or edema.  2+ distal pulses.   Assessment and Plan:  1. Well woman exam - Routine care - Cytology - PAP( [facility]) - Encouraged prenatal vitamins in anticipation of pregnancy   Will follow up results of pap smear and manage accordingly. Routine preventative health maintenance measures emphasized. Please refer to After Visit Summary for other counseling recommendations.   Thressa ShellerHeather Hogan DNP, CNM  01/29/18  2:45 PM

## 2018-01-29 NOTE — Patient Instructions (Signed)
Pap Test  Why am I having this test?  A Pap test, also called a Pap smear, is a screening test to check for signs of:  · Cancer of the vagina, cervix, and uterus. The cervix is the lower part of the uterus that opens into the vagina.  · Infection.  · Changes that may be a sign that cancer is developing (precancerous changes).  Women need this test on a regular basis. In general, you should have a Pap test every 3 years until you reach menopause or age 36. Women aged 30-60 may choose to have their Pap test done at the same time as an HPV (human papillomavirus) test every 5 years (instead of every 3 years).  Your health care provider may recommend having Pap tests more or less often depending on your medical conditions and past Pap test results.  What kind of sample is taken?    Your health care provider will collect a sample of cells from the surface of your cervix. This will be done using a small cotton swab, plastic spatula, or brush. This sample is often collected during a pelvic exam, when you are lying on your back on an exam table with feet in footrests (stirrups).  In some cases, fluids (secretions) from the cervix or vagina may also be collected.  How do I prepare for this test?  · Be aware of where you are in your menstrual cycle. If you are menstruating on the day of the test, you may be asked to reschedule.  · You may need to reschedule if you have a known vaginal infection on the day of the test.  · Follow instructions from your health care provider about:  ? Changing or stopping your regular medicines. Some medicines can cause abnormal test results, such as digitalis and tetracycline.  ? Avoiding douching or taking a bath the day before or the day of the test.  Tell a health care provider about:  · Any allergies you have.  · All medicines you are taking, including vitamins, herbs, eye drops, creams, and over-the-counter medicines.  · Any blood disorders you have.  · Any surgeries you have had.  · Any  medical conditions you have.  · Whether you are pregnant or may be pregnant.  How are the results reported?  Your test results will be reported as either abnormal or normal.  A false-positive result can occur. A false positive is incorrect because it means that a condition is present when it is not.  A false-negative result can occur. A false negative is incorrect because it means that a condition is not present when it is.  What do the results mean?  A normal test result means that you do not have signs of cancer of the vagina, cervix, or uterus.  An abnormal result may mean that you have:  · Cancer. A Pap test by itself is not enough to diagnose cancer. You will have more tests done in this case.  · Precancerous changes in your vagina, cervix, or uterus.  · Inflammation of the cervix.  · An STD (sexually transmitted disease).  · A fungal infection.  · A parasite infection.  Talk with your health care provider about what your results mean.  Questions to ask your health care provider  Ask your health care provider, or the department that is doing the test:  · When will my results be ready?  · How will I get my results?  · What are my   treatment options?  · What other tests do I need?  · What are my next steps?  Summary  · In general, women should have a Pap test every 3 years until they reach menopause or age 36.  · Your health care provider will collect a sample of cells from the surface of your cervix. This will be done using a small cotton swab, plastic spatula, or brush.  · In some cases, fluids (secretions) from the cervix or vagina may also be collected.  This information is not intended to replace advice given to you by your health care provider. Make sure you discuss any questions you have with your health care provider.  Document Released: 04/02/2002 Document Revised: 09/19/2016 Document Reviewed: 09/19/2016  Elsevier Interactive Patient Education © 2019 [employer] Inc.

## 2018-01-30 LAB — CYTOLOGY - PAP
Adequacy: ABSENT
Chlamydia: NEGATIVE
Diagnosis: NEGATIVE
HPV: NOT DETECTED
NEISSERIA GONORRHEA: NEGATIVE

## 2018-01-30 LAB — CERVICOVAGINAL ANCILLARY ONLY
Bacterial vaginitis: POSITIVE — AB
Candida vaginitis: NEGATIVE
Trichomonas: NEGATIVE

## 2018-01-30 MED ORDER — METRONIDAZOLE 500 MG PO TABS: 500.0000 mg | ORAL_TABLET | Freq: Two times a day (BID) | ORAL | 0 refills | Status: DC

## 2018-01-30 NOTE — Addendum Note (Signed)
Addended by: Thressa Sheller D on: 01/30/2018 08:43 PM   Modules accepted: Orders

## 2018-08-31 ENCOUNTER — Other Ambulatory Visit: Payer: Self-pay | Admitting: *Deleted

## 2018-08-31 DIAGNOSIS — Z20822 Contact with and (suspected) exposure to covid-19: Secondary | ICD-10-CM

## 2018-09-01 LAB — NOVEL CORONAVIRUS, NAA: SARS-CoV-2, NAA: NOT DETECTED

## 2018-09-19 IMAGING — US US OB TRANSVAGINAL
1 series · 15 of 28 positions shown · non-contrast
Comparison: None.

CLINICAL DATA: Bleeding in early pregnancy

EXAM:
TRANSVAGINAL OB ULTRASOUND
TECHNIQUE: Transvaginal ultrasound was performed for complete evaluation of the
gestation as well as the maternal uterus, adnexal regions, and
pelvic cul-de-sac.

[Series 1: us ob transvaginal · 15 of 45 slices shown]
[im 1/45]
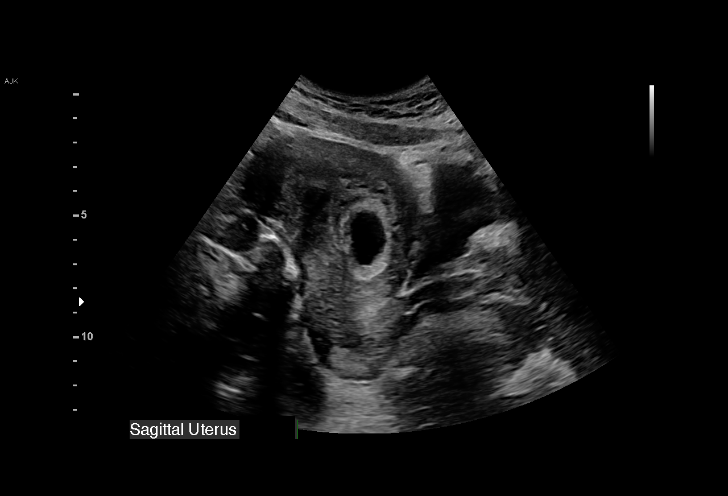
[im 4/45]
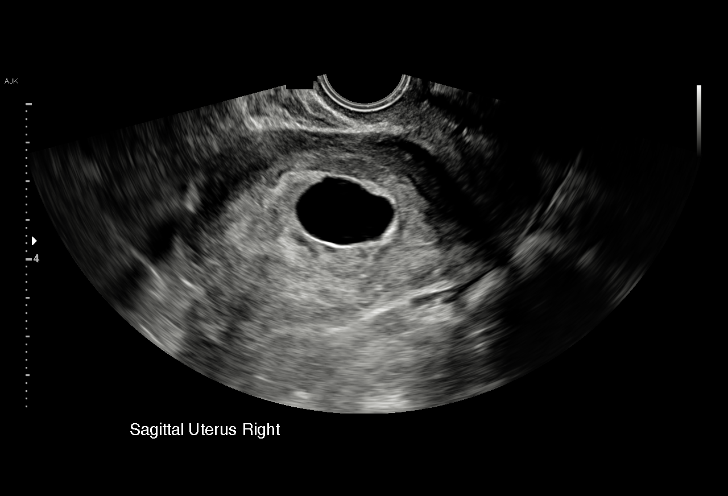
[im 7/45]
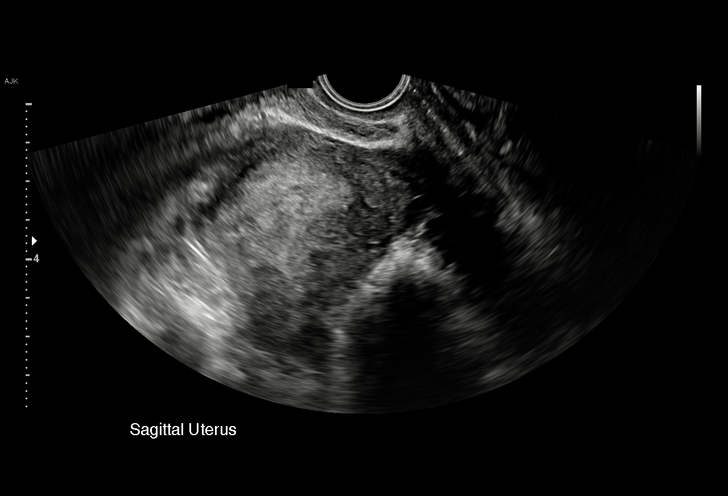
[im 10/45]
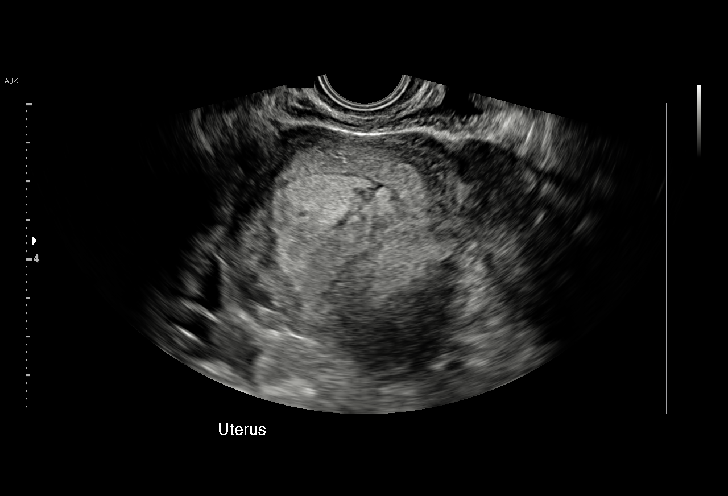
[im 14/45]
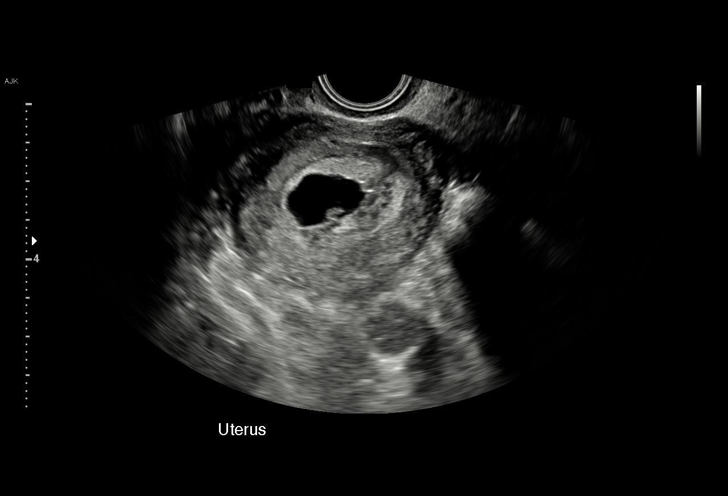
[im 17/45]
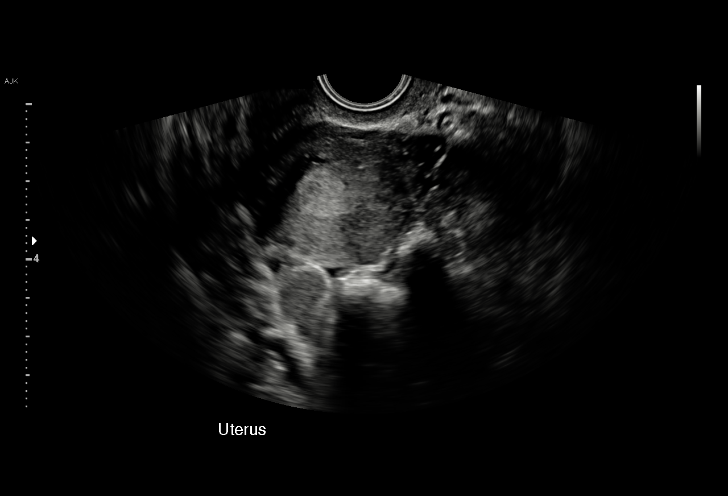
[im 20/45]
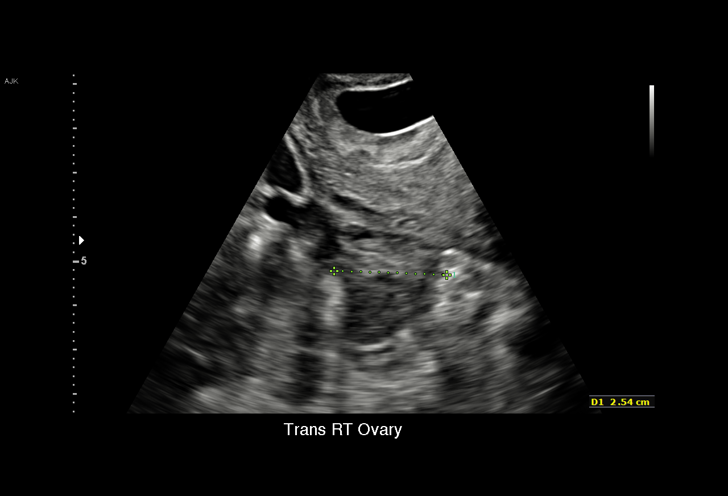
[im 23/45]
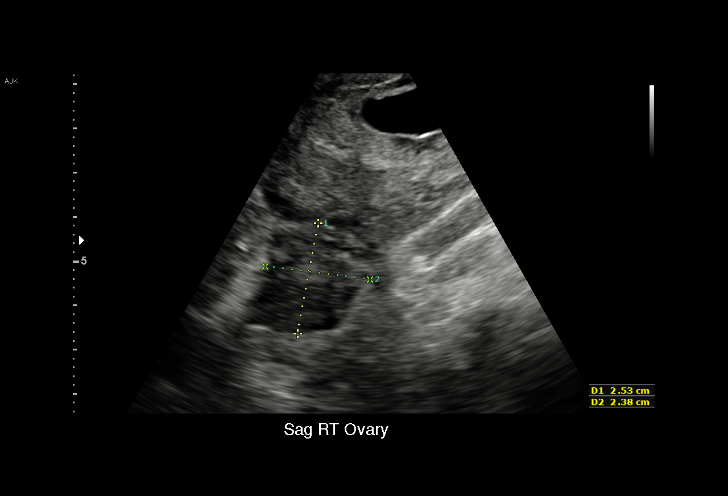
[im 25/45]
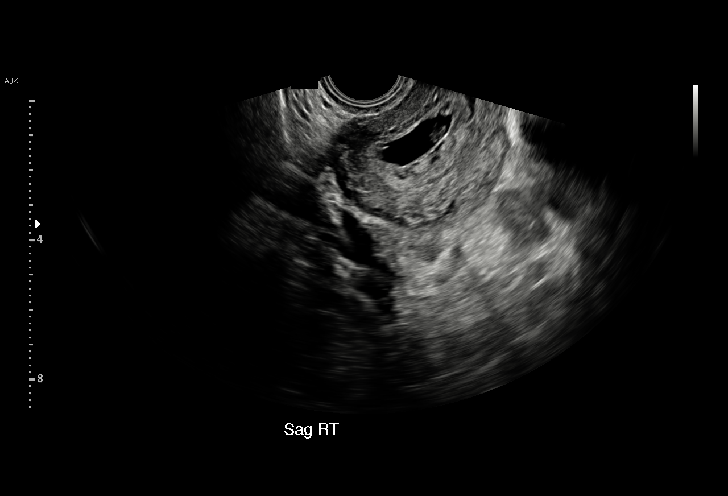
[im 28/45]
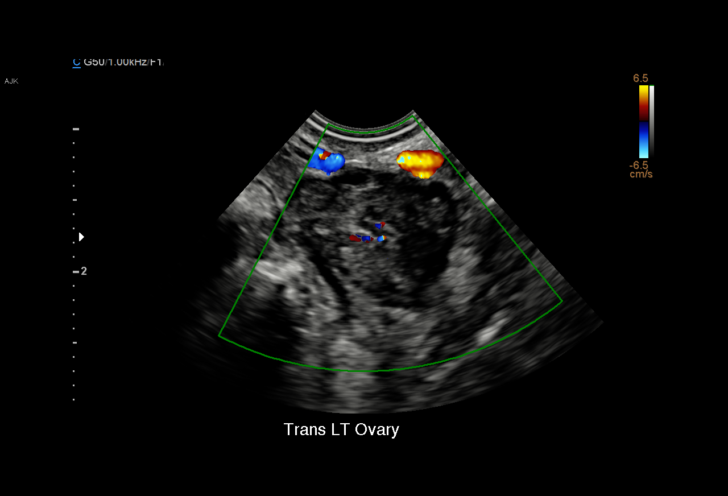
[im 31/45]
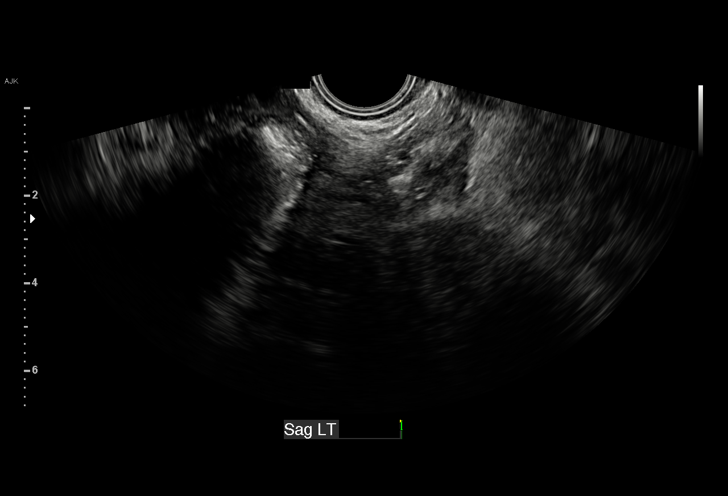
[im 35/45]
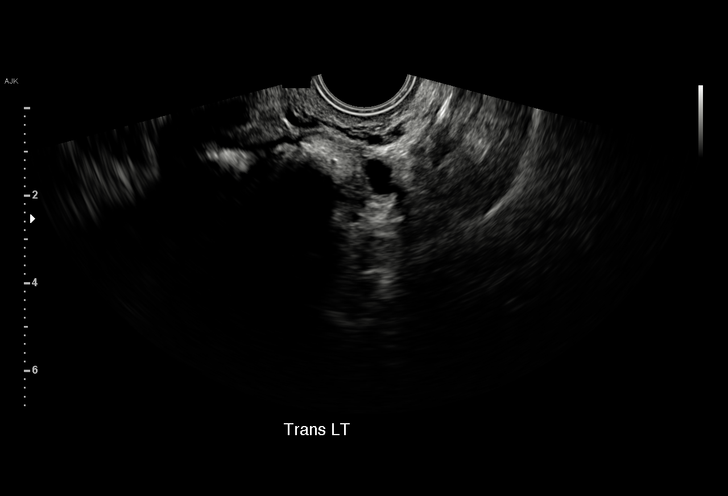
[im 38/45]
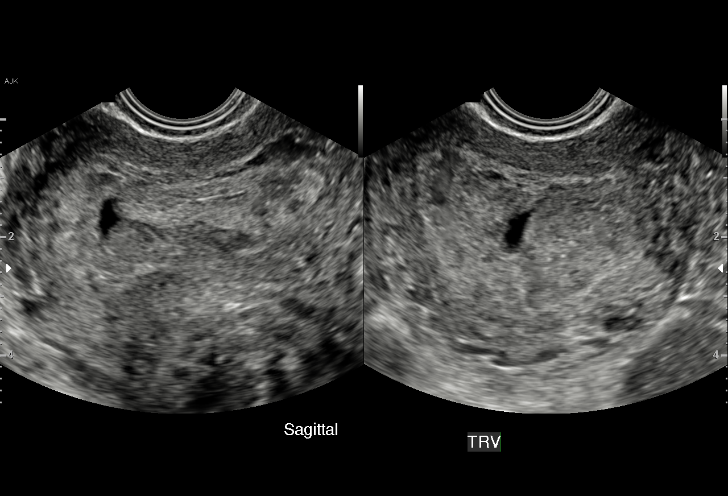
[im 41/45]
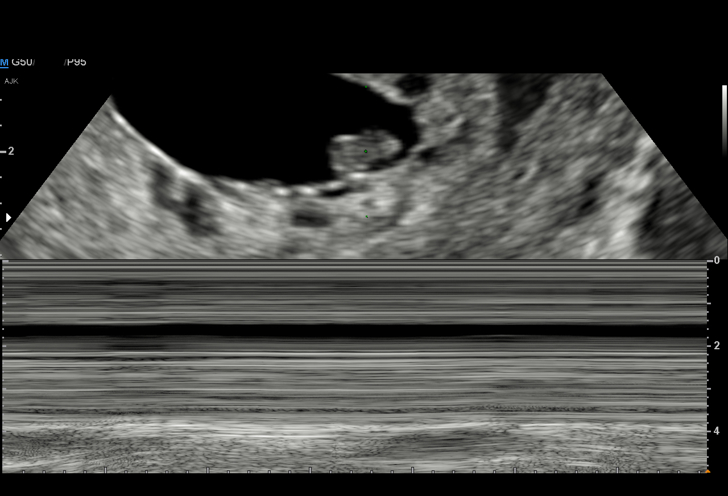
[im 45/45]
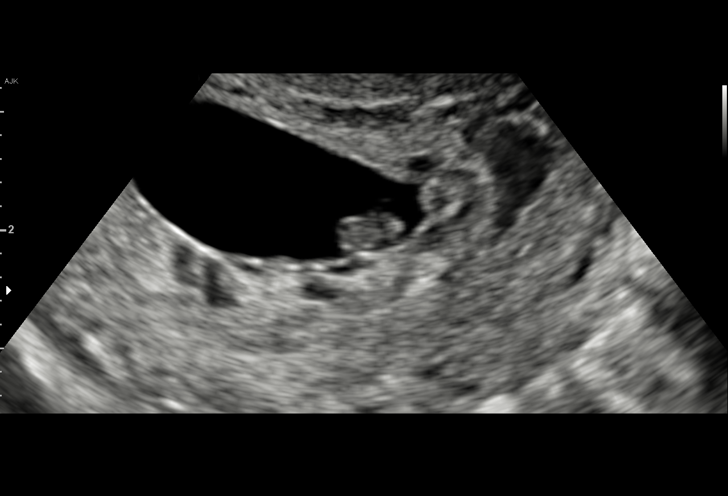

[15 of 28 positions shown; findings below may reference images not displayed]

FINDINGS: Intrauterine gestational sac: Single, mildly irregular.

Yolk sac:  Not visible

Embryo:  Visible

Cardiac Activity: Not visible

Heart Rate:  bpm

MSD:   mm    w     d

CRL:   6  mm   6 w 2 d                  US EDC: 04/09/2017

Subchorionic hemorrhage:  Small

Maternal uterus/adnexae: Normal ovaries. 4 cm posterior uterine
fibroid. No abnormal pelvic fluid collection.
IMPRESSION: Single intrauterine gestation measuring 6 weeks 2 days, with no
visible cardiac activity.

Findings are suspicious for failed pregnancy. Recommend follow-up US
in 10-14 days for definitive diagnosis. This recommendation follows
SRU consensus guidelines: Diagnostic Criteria for Nonviable
Pregnancy Early in the First Trimester. N Engl J Med 9359;

## 2019-08-23 ENCOUNTER — Telehealth (INDEPENDENT_AMBULATORY_CARE_PROVIDER_SITE_OTHER): Payer: Self-pay | Admitting: Family Medicine

## 2019-08-23 DIAGNOSIS — Z0289 Encounter for other administrative examinations: Secondary | ICD-10-CM

## 2019-08-23 NOTE — Telephone Encounter (Signed)
Patient want a referral to a Fertility Doctor

## 2019-08-27 NOTE — Telephone Encounter (Signed)
Called patient and she states she tried to [facility] and they told her she needed a referral from [place] as well as an updated pap smear. Discussed with patient her pap smear is not technically due until 01/2021 but if she has insurance a pap smear will be covered annually. Recommended we make her an annual exam appt and she can discuss referral at that time. Patient verbalized understanding.

## 2019-09-24 ENCOUNTER — Ambulatory Visit: Payer: Self-pay | Admitting: Advanced Practice Midwife

## 2019-09-24 ENCOUNTER — Other Ambulatory Visit: Payer: Self-pay

## 2019-10-16 ENCOUNTER — Ambulatory Visit: Payer: Self-pay | Admitting: Advanced Practice Midwife

## 2019-10-16 ENCOUNTER — Other Ambulatory Visit: Payer: Self-pay

## 2019-12-03 ENCOUNTER — Encounter: Payer: Self-pay | Admitting: *Deleted

## 2020-05-15 ENCOUNTER — Other Ambulatory Visit: Payer: Self-pay

## 2020-05-15 ENCOUNTER — Ambulatory Visit (HOSPITAL_COMMUNITY)
Admission: EM | Admit: 2020-05-15 | Discharge: 2020-05-15 | Disposition: A | Discharge status: Home / Self Care | Payer: Self-pay | Attending: Student | Admitting: Student

## 2020-05-15 ENCOUNTER — Encounter (HOSPITAL_COMMUNITY): Payer: Self-pay | Admitting: Emergency Medicine

## 2020-05-15 DIAGNOSIS — R590 Localized enlarged lymph nodes: Secondary | ICD-10-CM

## 2020-05-15 MED ORDER — PREDNISONE 20 MG PO TABS: 40.0000 mg | ORAL_TABLET | Freq: Every day | ORAL | 0 refills | Status: AC

## 2020-05-15 MED ORDER — IBUPROFEN 800 MG PO TABS: 800.0000 mg | ORAL_TABLET | Freq: Three times a day (TID) | ORAL | 0 refills | Status: DC

## 2020-05-15 NOTE — ED Triage Notes (Signed)
Pt presents today with c/o of pain to left neck x 1 month. Difficulty with turning head to left. Denies injury.

## 2020-05-15 NOTE — ED Provider Notes (Signed)
[facility]    CSN: [id number] Arrival date & time: 05/15/20  1647      History   Chief Complaint Chief Complaint  Patient presents with  . Neck Pain    left    HPI Olivia Giles is a 40 y.o. female presenting with left-sided neck pain x1 month. Medical history noncontributory.   Notes areas of swelling along the left side of her neck.  States this has been getting worse for the last month.  Tylenol providing little relief.  States she has tried rubbing various creams on it without relief.  Denies pain elsewhere.  Denies fever/chills, URI symptoms.  HPI  Past Medical History:  Diagnosis Date  . Asthma   . Bronchitis     Patient Active Problem List   Diagnosis Date Noted  . SAB (spontaneous abortion) 08/21/2016    Past Surgical History:  Procedure Laterality Date  . NO PAST SURGERIES      OB History    Gravida  2   Para  0   Term      Preterm      AB  2   Living        SAB  2   IAB      Ectopic      Multiple      Live Births               Home Medications    Prior to Admission medications   Medication Sig Start Date End Date Taking? Authorizing Provider  ibuprofen (ADVIL) 800 MG tablet Take 1 tablet (800 mg total) by mouth 3 (three) times daily. 05/15/20  Yes Rhys Martini, PA-C  predniSONE (DELTASONE) 20 MG tablet Take 2 tablets (40 mg total) by mouth daily for 5 days. 05/15/20 05/20/20 Yes Rhys Martini, PA-C  promethazine (PHENERGAN) 12.5 MG tablet Take 1 tablet (12.5 mg total) by mouth every 6 (six) hours as needed for nausea or vomiting. Patient not taking: No sig reported 08/16/16 05/15/20  Raelyn Mora, CNM    Family History Family History  Problem Relation Age of Onset  . Hypertension Mother   . Hypertension Father   . Hypertension Sister     Social History Social History   Tobacco Use  . Smoking status: Former Games developer  . Smokeless tobacco: Never Used  Vaping Use  . Vaping Use: Never used  Substance Use  Topics  . Alcohol use: No  . Drug use: No     Allergies   Bee venom   Review of Systems Review of Systems  Musculoskeletal: Positive for neck pain.  All other systems reviewed and are negative.    Physical Exam Triage Vital Signs ED Triage Vitals  Enc Vitals Group     BP 05/15/20 1740 134/76     Pulse Rate 05/15/20 1740 73     Resp 05/15/20 1740 20     Temp 05/15/20 1740 98.7 F (37.1 C)     Temp Source 05/15/20 1740 Oral     SpO2 05/15/20 1740 100 %     Weight --      Height --      Head Circumference --      Peak Flow --      Pain Score 05/15/20 1735 5     Pain Loc --      Pain Edu? --      Excl. in GC? --    No data found.  Updated Vital Signs BP  134/76 (BP Location: Right Arm)   Pulse 73   Temp 98.7 F (37.1 C) (Oral)   Resp 20   LMP 05/13/2020 (Exact Date)   SpO2 100%   Visual Acuity Right Eye Distance:   Left Eye Distance:   Bilateral Distance:    Right Eye Near:   Left Eye Near:    Bilateral Near:     Physical Exam Vitals reviewed.  Constitutional:      General: She is not in acute distress.    Appearance: Normal appearance. She is not ill-appearing or diaphoretic.  HENT:     Head: Normocephalic and atraumatic.  Neck:     Comments: Anterior neck- multiple LNs enlarged and tender along cervical chain. Mobile. No warmth or erythema. Cardiovascular:     Rate and Rhythm: Normal rate and regular rhythm.     Heart sounds: Normal heart sounds.  Pulmonary:     Effort: Pulmonary effort is normal.     Breath sounds: Normal breath sounds.  Lymphadenopathy:     Cervical: Cervical adenopathy present.     Left cervical: Superficial cervical adenopathy present.  Skin:    General: Skin is warm.  Neurological:     General: No focal deficit present.     Mental Status: She is alert and oriented to person, place, and time.  Psychiatric:        Mood and Affect: Mood normal.        Behavior: Behavior normal.        Thought Content: Thought content  normal.        Judgment: Judgment normal.      UC Treatments / Results  Labs (all labs ordered are listed, but only abnormal results are displayed) Labs Reviewed - No data to display  EKG   Radiology No results found.  Procedures Procedures (including critical care time)  Medications Ordered in UC Medications - No data to display  Initial Impression / Assessment and Plan / UC Course  I have reviewed the triage vital signs and the nursing notes.  Pertinent labs & imaging results that were available during my care of the patient were reviewed by me and considered in my medical decision making (see chart for details).     This patient is a 40 year old female presenting with left-sided anterior cervical lymphadenopathy for 1 month, getting worse.  Trial of prednisone as below, and recommended close follow-up with PCP to ensure resolution.  Would consider ultrasound if symptoms persist.  ED return precautions discussed.  Final Clinical Impressions(s) / UC Diagnoses   Final diagnoses:  Lymphadenopathy of left cervical region     Discharge Instructions     -Take Tylenol 1000 mg 3 times daily, and ibuprofen 800 mg 3 times daily with food.  You can take these together, or alternate every 3-4 hours. -Prednisone two pills taken at the same time for 5 days in a row.  Try taking this earlier in the day as it can give you energy. -Follow-up with primary care in 1 to 2 weeks for recheck.     ED Prescriptions    Medication Sig Dispense Auth. Provider   predniSONE (DELTASONE) 20 MG tablet Take 2 tablets (40 mg total) by mouth daily for 5 days. 10 tablet Rhys Martini, PA-C   ibuprofen (ADVIL) 800 MG tablet Take 1 tablet (800 mg total) by mouth 3 (three) times daily. 21 tablet Rhys Martini, PA-C     PDMP not reviewed this encounter.   Rhys Martini,  PA-C 05/15/20 1911

## 2020-05-15 NOTE — Discharge Instructions (Addendum)
-  Take Tylenol 1000 mg 3 times daily, and ibuprofen 800 mg 3 times daily with food.  You can take these together, or alternate every 3-4 hours. -Prednisone two pills taken at the same time for 5 days in a row.  Try taking this earlier in the day as it can give you energy. -Follow-up with primary care in 1 to 2 weeks for recheck.

## 2020-06-10 ENCOUNTER — Encounter (HOSPITAL_COMMUNITY): Payer: Self-pay

## 2020-06-10 ENCOUNTER — Other Ambulatory Visit: Payer: Self-pay

## 2020-06-10 ENCOUNTER — Ambulatory Visit (HOSPITAL_COMMUNITY)
Admission: EM | Admit: 2020-06-10 | Discharge: 2020-06-10 | Disposition: A | Discharge status: Home / Self Care | Payer: Self-pay | Attending: Medical Oncology | Admitting: Medical Oncology

## 2020-06-10 DIAGNOSIS — J069 Acute upper respiratory infection, unspecified: Secondary | ICD-10-CM

## 2020-06-10 DIAGNOSIS — Z791 Long term (current) use of non-steroidal anti-inflammatories (NSAID): Secondary | ICD-10-CM | POA: Insufficient documentation

## 2020-06-10 DIAGNOSIS — Z87891 Personal history of nicotine dependence: Secondary | ICD-10-CM | POA: Insufficient documentation

## 2020-06-10 DIAGNOSIS — U071 COVID-19: Secondary | ICD-10-CM | POA: Insufficient documentation

## 2020-06-10 DIAGNOSIS — J45909 Unspecified asthma, uncomplicated: Secondary | ICD-10-CM | POA: Insufficient documentation

## 2020-06-10 MED ORDER — FLUTICASONE PROPIONATE 50 MCG/ACT NA SUSP: 2.0000 | Freq: Every day | NASAL | 0 refills | Status: DC

## 2020-06-10 MED ORDER — ALBUTEROL SULFATE HFA 108 (90 BASE) MCG/ACT IN AERS: 1.0000 | INHALATION_SPRAY | Freq: Four times a day (QID) | RESPIRATORY_TRACT | 0 refills | Status: AC | PRN

## 2020-06-10 MED ORDER — BENZONATATE 100 MG PO CAPS: 100.0000 mg | ORAL_CAPSULE | Freq: Three times a day (TID) | ORAL | 0 refills | Status: DC

## 2020-06-10 NOTE — ED Triage Notes (Signed)
Pt in with c/o dry cough, body aches, headaches that started today.  Pt states it is hard to talk or breathe at times

## 2020-06-10 NOTE — ED Provider Notes (Signed)
[facility]    CSN: [id number] Arrival date & time: 06/10/20  1602      History   Chief Complaint Chief Complaint  Patient presents with  . Generalized Body Aches  . Headache  . Cough    HPI Olivia Giles is a 40 y.o. female.   HPI   Cold Symptoms: Patient reports that for the past 1 day she has had dry cough, body aches, headache.  She has a history of asthma and feels that her asthma is slightly activated.  She is not having any significant chest pain, shortness of breath or wheezing.  No loss of taste or smell.  No GI symptoms at this time.  She has not tried anything for symptoms.  No known sick contacts.   Past Medical History:  Diagnosis Date  . Asthma   . Bronchitis     Patient Active Problem List   Diagnosis Date Noted  . SAB (spontaneous abortion) 08/21/2016    Past Surgical History:  Procedure Laterality Date  . NO PAST SURGERIES      OB History    Gravida  2   Para  0   Term      Preterm      AB  2   Living        SAB  2   IAB      Ectopic      Multiple      Live Births               Home Medications    Prior to Admission medications   Medication Sig Start Date End Date Taking? Authorizing Provider  ibuprofen (ADVIL) 800 MG tablet Take 1 tablet (800 mg total) by mouth 3 (three) times daily. 05/15/20   Rhys Martini, PA-C  promethazine (PHENERGAN) 12.5 MG tablet Take 1 tablet (12.5 mg total) by mouth every 6 (six) hours as needed for nausea or vomiting. Patient not taking: No sig reported 08/16/16 05/15/20  Raelyn Mora, CNM    Family History Family History  Problem Relation Age of Onset  . Hypertension Mother   . Hypertension Father   . Hypertension Sister     Social History Social History   Tobacco Use  . Smoking status: Former Games developer  . Smokeless tobacco: Never Used  Vaping Use  . Vaping Use: Never used  Substance Use Topics  . Alcohol use: No  . Drug use: No     Allergies   Bee  venom   Review of Systems Review of Systems  As stated above in HPI Physical Exam Triage Vital Signs ED Triage Vitals  Enc Vitals Group     BP 06/10/20 1638 (!) 122/93     Pulse Rate 06/10/20 1638 92     Resp 06/10/20 1638 18     Temp 06/10/20 1638 100.2 F (37.9 C)     Temp Source 06/10/20 1638 Oral     SpO2 06/10/20 1638 98 %     Weight --      Height --      Head Circumference --      Peak Flow --      Pain Score 06/10/20 1637 5     Pain Loc --      Pain Edu? --      Excl. in [place]? --    No data found.  Updated Vital Signs BP (!) 122/93   Pulse 92   Temp 100.2 F (37.9 C) (Oral)  Resp 18   LMP 05/13/2020 (Exact Date)   SpO2 98%   Physical Exam Vitals and nursing note reviewed.  Constitutional:      General: She is not in acute distress.    Appearance: She is well-developed. She is not ill-appearing, toxic-appearing or diaphoretic.  HENT:     Head: Normocephalic and atraumatic.  Eyes:     Extraocular Movements: Extraocular movements intact.     Pupils: Pupils are equal, round, and reactive to light.  Cardiovascular:     Rate and Rhythm: Normal rate and regular rhythm.     Heart sounds: Normal heart sounds.  Pulmonary:     Effort: Pulmonary effort is normal.     Breath sounds: Normal breath sounds.  Abdominal:     Palpations: Abdomen is soft.  Musculoskeletal:     Cervical back: Normal range of motion and neck supple.  Skin:    General: Skin is warm.  Neurological:     Mental Status: She is alert and oriented to person, place, and time.      UC Treatments / Results  Labs (all labs ordered are listed, but only abnormal results are displayed) Labs Reviewed - No data to display  EKG   Radiology No results found.  Procedures Procedures (including critical care time)  Medications Ordered in UC Medications - No data to display  Initial Impression / Assessment and Plan / UC Course  I have reviewed the triage vital signs and the nursing  notes.  Pertinent labs & imaging results that were available during my care of the patient were reviewed by me and considered in my medical decision making (see chart for details).     New. Likely viral in nature which we discussed. COVID-19 testing pending. Rest, hydration, healthy diet. Discussed O2 monitoring. Discussed red flag sign and symptoms.    Final Clinical Impressions(s) / UC Diagnoses   Final diagnoses:  None   Discharge Instructions   None    ED Prescriptions    None     PDMP not reviewed this encounter.   Rushie Chestnut, [place] 06/10/20 1735

## 2020-06-11 LAB — SARS CORONAVIRUS 2 (TAT 6-24 HRS): SARS Coronavirus 2: POSITIVE — AB

## 2020-06-12 ENCOUNTER — Telehealth: Payer: Self-pay | Admitting: Physician Assistant

## 2020-06-12 NOTE — Telephone Encounter (Signed)
Called to discuss with patient about Covid symptoms and the use of a COVID Tx for those with mild to moderate Covid symptoms and at a high risk of hospitalization.   Pt is qualified for the monoclonal antibody infusion and oral antiviral but not interested in either of them. Preventative practices reviewed. Patient verbalized understanding.  [place], [place]  06/12/2020 10:00 AM

## 2021-10-22 ENCOUNTER — Encounter (HOSPITAL_COMMUNITY): Payer: Self-pay | Admitting: *Deleted

## 2021-10-22 ENCOUNTER — Ambulatory Visit (HOSPITAL_COMMUNITY)
Admission: EM | Admit: 2021-10-22 | Discharge: 2021-10-22 | Disposition: A | Discharge status: Home / Self Care | Payer: PRIVATE HEALTH INSURANCE | Attending: Family Medicine | Admitting: Family Medicine

## 2021-10-22 DIAGNOSIS — K121 Other forms of stomatitis: Secondary | ICD-10-CM | POA: Diagnosis not present

## 2021-10-22 DIAGNOSIS — R591 Generalized enlarged lymph nodes: Secondary | ICD-10-CM | POA: Diagnosis not present

## 2021-10-22 DIAGNOSIS — R051 Acute cough: Secondary | ICD-10-CM | POA: Diagnosis not present

## 2021-10-22 DIAGNOSIS — Z79899 Other long term (current) drug therapy: Secondary | ICD-10-CM | POA: Diagnosis not present

## 2021-10-22 DIAGNOSIS — Z1152 Encounter for screening for COVID-19: Secondary | ICD-10-CM | POA: Diagnosis not present

## 2021-10-22 LAB — CBC WITH DIFFERENTIAL/PLATELET
Abs Immature Granulocytes: 0.03 10*3/uL (ref 0.00–0.07)
Basophils Absolute: 0 10*3/uL (ref 0.0–0.1)
Basophils Relative: 0 %
Eosinophils Absolute: 0 10*3/uL (ref 0.0–0.5)
Eosinophils Relative: 0 %
HCT: 33.9 % — ABNORMAL LOW (ref 36.0–46.0)
Hemoglobin: 11.4 g/dL — ABNORMAL LOW (ref 12.0–15.0)
Immature Granulocytes: 1 %
Lymphocytes Relative: 51 %
Lymphs Abs: 1.9 10*3/uL (ref 0.7–4.0)
MCH: 31.4 pg (ref 26.0–34.0)
MCHC: 33.6 g/dL (ref 30.0–36.0)
MCV: 93.4 fL (ref 80.0–100.0)
Monocytes Absolute: 0.4 10*3/uL (ref 0.1–1.0)
Monocytes Relative: 10 %
Neutro Abs: 1.4 10*3/uL — ABNORMAL LOW (ref 1.7–7.7)
Neutrophils Relative %: 38 %
Platelets: 294 10*3/uL (ref 150–400)
RBC: 3.63 MIL/uL — ABNORMAL LOW (ref 3.87–5.11)
RDW: 13.5 % (ref 11.5–15.5)
WBC: 3.7 10*3/uL — ABNORMAL LOW (ref 4.0–10.5)
nRBC: 0.5 % — ABNORMAL HIGH (ref 0.0–0.2)

## 2021-10-22 MED ORDER — ACYCLOVIR 400 MG PO TABS: 400.0000 mg | ORAL_TABLET | Freq: Every day | ORAL | 0 refills | Status: AC

## 2021-10-22 NOTE — ED Provider Notes (Signed)
[facility]    CSN: [id number] Arrival date & time: 10/22/21  1714      History   Chief Complaint Chief Complaint  Patient presents with   Cough   Sore Throat   Headache   Mass    HPI Olivia Giles is a 41 y.o. female.    Cough Associated symptoms: headaches   Sore Throat Associated symptoms include headaches.  Headache Associated symptoms: cough    Here for a knot on her left neck that she first noticed about 2 weeks ago.  It was very tender and hurts to turn her head.  Since then it has reduced in size but she still feels swelling there.  No fever noted.  She has had sore throat in the last 1 to 2 weeks, noting that sometimes it feels itchy and sometimes it gets better in the afternoon.  No nausea or vomiting or diarrhea.  She has had some headache.  She maybe had a night sweat once  She does started coughing in the last 24 hours  Past Medical History:  Diagnosis Date   Asthma    Bronchitis     Patient Active Problem List   Diagnosis Date Noted   SAB (spontaneous abortion) 08/21/2016    Past Surgical History:  Procedure Laterality Date   NO PAST SURGERIES      OB History     Gravida  2   Para  0   Term      Preterm      AB  2   Living         SAB  2   IAB      Ectopic      Multiple      Live Births               Home Medications    Prior to Admission medications   Medication Sig Start Date End Date Taking? Authorizing Provider  acyclovir (ZOVIRAX) 400 MG tablet Take 1 tablet (400 mg total) by mouth 5 (five) times daily for 5 days. 10/22/21 10/27/21 Yes Larone Kliethermes, Gwenlyn Perking, MD  albuterol (VENTOLIN HFA) 108 (90 Base) MCG/ACT inhaler Inhale 1-2 puffs into the lungs every 6 (six) hours as needed for wheezing or shortness of breath. 06/10/20   Hughie Closs, PA-C  fluticasone (FLONASE) 50 MCG/ACT nasal spray Place 2 sprays into both nostrils daily. 06/10/20   Hughie Closs, PA-C  promethazine (PHENERGAN) 12.5 MG  tablet Take 1 tablet (12.5 mg total) by mouth every 6 (six) hours as needed for nausea or vomiting. Patient not taking: No sig reported 08/16/16 05/15/20  Laury Deep, CNM    Family History Family History  Problem Relation Age of Onset   Hypertension Mother    Hypertension Father    Hypertension Sister     Social History Social History   Tobacco Use   Smoking status: Former   Smokeless tobacco: Never  Scientific laboratory technician Use: Never used  Substance Use Topics   Alcohol use: No   Drug use: No     Allergies   Bee venom   Review of Systems Review of Systems  Respiratory:  Positive for cough.   Neurological:  Positive for headaches.     Physical Exam Triage Vital Signs ED Triage Vitals  Enc Vitals Group     BP 10/22/21 1741 124/85     Pulse Rate 10/22/21 1741 83     Resp 10/22/21 1741 18  Temp 10/22/21 1741 98.9 F (37.2 C)     Temp Source 10/22/21 1741 Oral     SpO2 10/22/21 1741 98 %     Weight --      Height --      Head Circumference --      Peak Flow --      Pain Score 10/22/21 1739 4     Pain Loc --      Pain Edu? --      Excl. in [place]? --    No data found.  Updated Vital Signs BP 124/85 (BP Location: Right Arm)   Pulse 83   Temp 98.9 F (37.2 C) (Oral)   Resp 18   LMP 10/19/2021 (Approximate)   SpO2 98%   Visual Acuity Right Eye Distance:   Left Eye Distance:   Bilateral Distance:    Right Eye Near:   Left Eye Near:    Bilateral Near:     Physical Exam Vitals reviewed.  Constitutional:      General: She is not in acute distress.    Appearance: She is not toxic-appearing.  HENT:     Right Ear: Tympanic membrane and ear canal normal.     Left Ear: Tympanic membrane and ear canal normal.     Nose: Nose normal.     Mouth/Throat:     Mouth: Mucous membranes are moist.     Comments: There is no erythema or tonsillar hypertrophy in the oropharynx.  There are herpetiform ulcerations on her left soft palate.  There is no swelling or  other asymmetry. Eyes:     Extraocular Movements: Extraocular movements intact.     Conjunctiva/sclera: Conjunctivae normal.     Pupils: Pupils are equal, round, and reactive to light.  Neck:     Comments: There is some diffuse swelling over her left sternocleidomastoid.  I do not feel a discrete mass at this time Cardiovascular:     Rate and Rhythm: Normal rate and regular rhythm.     Heart sounds: No murmur heard. Pulmonary:     Effort: Pulmonary effort is normal. No respiratory distress.     Breath sounds: No stridor. No wheezing, rhonchi or rales.  Musculoskeletal:     Cervical back: Neck supple.  Skin:    Capillary Refill: Capillary refill takes less than 2 seconds.     Coloration: Skin is not jaundiced or pale.  Neurological:     General: No focal deficit present.     Mental Status: She is alert and oriented to person, place, and time.  Psychiatric:        Behavior: Behavior normal.      UC Treatments / Results  Labs (all labs ordered are listed, but only abnormal results are displayed) Labs Reviewed  HSV CULTURE AND TYPING  SARS CORONAVIRUS 2 (TAT 6-24 HRS)  CBC WITH DIFFERENTIAL/PLATELET    EKG   Radiology No results found.  Procedures Procedures (including critical care time)  Medications Ordered in UC Medications - No data to display  Initial Impression / Assessment and Plan / UC Course  I have reviewed the triage vital signs and the nursing notes.  Pertinent labs & imaging results that were available during my care of the patient were reviewed by me and considered in my medical decision making (see chart for details).        Herpes culture swab done of the ulcerations in her mouth.  I am going to go ahead and treat with acyclovir, though  this may be outside the window where it would be most effective.  CBC is drawn today and we will let her know if anything is urgently abnormal on that.  She has a new patient appointment coming up for October 11  with [facility].  She will follow-up with them.  COVID swab is done also with a new cough.  We will let her know if anything is positive and she would benefit from a prescription for Paxlovid, as her EGFR was over 60 when last done. Final Clinical Impressions(s) / UC Diagnoses   Final diagnoses:  Oral ulceration  Lymphadenopathy  Acute cough     Discharge Instructions      Take acyclovir 400 mg--1 tablet 5 times daily for 5 days.  Tylenol or ibuprofen as needed for pain or fever  We have done a swab for oral herpes, and staff will notify you if it is positive   You have been swabbed for COVID, and the test will result in the next 24 hours. Our staff will call you if positive. If the test is positive, you should quarantine for 5 days from the start of your symptoms.   If you see a positive result on MyChart over the weekend and or not receiving a call, please call our clinic      ED Prescriptions     Medication Sig Dispense Auth. Provider   acyclovir (ZOVIRAX) 400 MG tablet Take 1 tablet (400 mg total) by mouth 5 (five) times daily for 5 days. 25 tablet Haivyn Oravec, Gwenlyn Perking, MD      PDMP not reviewed this encounter.   Barrett Henle, MD 10/22/21 [phone]

## 2021-10-22 NOTE — ED Triage Notes (Signed)
Pt says that 2 weeks ago she woke up with a lump on the left side of her neck since the lump has went down some but that area is very sore. She is not having headaches, sore throat and cough. She has been taking tylenol and Excedrin for the headaches. She is having back pain as well but she states that she stands all day at work.

## 2021-10-22 NOTE — Discharge Instructions (Addendum)
Take acyclovir 400 mg--1 tablet 5 times daily for 5 days.  Tylenol or ibuprofen as needed for pain or fever  We have done a swab for oral herpes, and staff will notify you if it is positive   You have been swabbed for COVID, and the test will result in the next 24 hours. Our staff will call you if positive. If the test is positive, you should quarantine for 5 days from the start of your symptoms.   If you see a positive result on MyChart over the weekend and or not receiving a call, please call our clinic

## 2021-10-23 LAB — SARS CORONAVIRUS 2 (TAT 6-24 HRS): SARS Coronavirus 2: NEGATIVE

## 2021-10-26 LAB — HSV CULTURE AND TYPING

## 2021-10-28 ENCOUNTER — Encounter: Payer: Self-pay | Admitting: Nurse Practitioner

## 2021-10-29 ENCOUNTER — Emergency Department (HOSPITAL_COMMUNITY)
Admission: EM | Admit: 2021-10-29 | Discharge: 2021-10-29 | Disposition: A | Discharge status: Home / Self Care | Payer: PRIVATE HEALTH INSURANCE | Attending: Emergency Medicine | Admitting: Emergency Medicine

## 2021-10-29 ENCOUNTER — Encounter (HOSPITAL_COMMUNITY): Payer: Self-pay

## 2021-10-29 ENCOUNTER — Emergency Department (HOSPITAL_COMMUNITY): Payer: PRIVATE HEALTH INSURANCE

## 2021-10-29 DIAGNOSIS — Z1152 Encounter for screening for COVID-19: Secondary | ICD-10-CM | POA: Insufficient documentation

## 2021-10-29 DIAGNOSIS — R590 Localized enlarged lymph nodes: Secondary | ICD-10-CM | POA: Diagnosis not present

## 2021-10-29 DIAGNOSIS — M542 Cervicalgia: Secondary | ICD-10-CM | POA: Insufficient documentation

## 2021-10-29 LAB — CBC WITH DIFFERENTIAL/PLATELET
Abs Immature Granulocytes: 0.03 10*3/uL (ref 0.00–0.07)
Basophils Absolute: 0 10*3/uL (ref 0.0–0.1)
Basophils Relative: 1 %
Eosinophils Absolute: 0 10*3/uL (ref 0.0–0.5)
Eosinophils Relative: 0 %
HCT: 37.8 % (ref 36.0–46.0)
Hemoglobin: 12.2 g/dL (ref 12.0–15.0)
Immature Granulocytes: 1 %
Lymphocytes Relative: 44 %
Lymphs Abs: 1.7 10*3/uL (ref 0.7–4.0)
MCH: 29.3 pg (ref 26.0–34.0)
MCHC: 32.3 g/dL (ref 30.0–36.0)
MCV: 90.9 fL (ref 80.0–100.0)
Monocytes Absolute: 0.6 10*3/uL (ref 0.1–1.0)
Monocytes Relative: 14 %
Neutro Abs: 1.6 10*3/uL — ABNORMAL LOW (ref 1.7–7.7)
Neutrophils Relative %: 40 %
Platelets: 262 10*3/uL (ref 150–400)
RBC: 4.16 MIL/uL (ref 3.87–5.11)
RDW: 13.3 % (ref 11.5–15.5)
WBC: 3.9 10*3/uL — ABNORMAL LOW (ref 4.0–10.5)
nRBC: 0 % (ref 0.0–0.2)

## 2021-10-29 LAB — BASIC METABOLIC PANEL
Anion gap: 7 (ref 5–15)
BUN: 9 mg/dL (ref 6–20)
CO2: 25 mmol/L (ref 22–32)
Calcium: 8.8 mg/dL — ABNORMAL LOW (ref 8.9–10.3)
Chloride: 104 mmol/L (ref 98–111)
Creatinine, Ser: 0.71 mg/dL (ref 0.44–1.00)
GFR, Estimated: >60 mL/min (ref >60)
Glucose, Bld: 70 mg/dL (ref 70–99)
Potassium: 3.7 mmol/L (ref 3.5–5.1)
Sodium: 136 mmol/L (ref 135–145)

## 2021-10-29 LAB — RESP PANEL BY RT-PCR (FLU A&B, COVID) ARPGX2
Influenza A by PCR: NEGATIVE
Influenza B by PCR: NEGATIVE
SARS Coronavirus 2 by RT PCR: NEGATIVE

## 2021-10-29 LAB — GROUP A STREP BY PCR: Group A Strep by PCR: NOT DETECTED

## 2021-10-29 MED ORDER — SODIUM CHLORIDE (PF) 0.9 % IJ SOLN
INTRAMUSCULAR | Status: AC
  Filled 2021-10-29: qty 50

## 2021-10-29 MED ORDER — AMOXICILLIN-POT CLAVULANATE 875-125 MG PO TABS: 1.0000 | ORAL_TABLET | Freq: Two times a day (BID) | ORAL | 0 refills | Status: AC

## 2021-10-29 MED ORDER — KETOROLAC TROMETHAMINE 30 MG/ML IJ SOLN
15.0000 mg | Freq: Once | INTRAMUSCULAR | Status: AC
  Administered 2021-10-29: 15 mg via INTRAVENOUS
  Filled 2021-10-29: qty 1

## 2021-10-29 MED ORDER — IOHEXOL 300 MG/ML  SOLN
100.0000 mL | Freq: Once | INTRAMUSCULAR | Status: AC | PRN
  Administered 2021-10-29: 75 mL via INTRAVENOUS

## 2021-10-29 NOTE — ED Provider Notes (Signed)
[facility] DEPT Provider Note   CSN: [id number] Arrival date & time: 10/29/21  1009     History  Chief Complaint  Patient presents with   Neck Pain    Olivia Giles is a 41 y.o. female.  Patient presenting with 2 months of left-sided neck pain.  She said originally it was much larger than it is now, but is so painful that everything makes it hurt including coughing, swallowing, touching it.  She had 2 doctors tell her he was to different things and she really wanted to get another opinion.  She says she has felt feverish and had night sweats recently.  She does not remember being sick prior to the swelling.  She has not really had any other symptoms.   Neck Pain      Home Medications Prior to Admission medications   Medication Sig Start Date End Date Taking? Authorizing Provider  amoxicillin-clavulanate (AUGMENTIN) 875-125 MG tablet Take 1 tablet by mouth every 12 (twelve) hours for 10 days. 10/29/21 11/08/21 Yes Stephaun Million, Adora Fridge, PA-C  albuterol (VENTOLIN HFA) 108 (90 Base) MCG/ACT inhaler Inhale 1-2 puffs into the lungs every 6 (six) hours as needed for wheezing or shortness of breath. 06/10/20   Hughie Closs, PA-C  fluticasone (FLONASE) 50 MCG/ACT nasal spray Place 2 sprays into both nostrils daily. 06/10/20   Hughie Closs, PA-C  promethazine (PHENERGAN) 12.5 MG tablet Take 1 tablet (12.5 mg total) by mouth every 6 (six) hours as needed for nausea or vomiting. Patient not taking: No sig reported 08/16/16 05/15/20  Laury Deep, CNM      Allergies    Bee venom    Review of Systems   Review of Systems  Constitutional:  Positive for diaphoresis.  HENT:         Neck swelling  Musculoskeletal:  Positive for neck pain.  All other systems reviewed and are negative.   Physical Exam Updated Vital Signs BP 109/71   Pulse 83   Temp 98.9 F (37.2 C) (Oral)   Resp 18   LMP 10/19/2021 (Approximate)   SpO2 100%  Physical  Exam Vitals and nursing note reviewed.  Constitutional:      General: She is not in acute distress.    Appearance: Normal appearance. She is well-developed. She is not ill-appearing, toxic-appearing or diaphoretic.  HENT:     Head: Normocephalic and atraumatic.     Right Ear: Tympanic membrane, ear canal and external ear normal. There is no impacted cerumen.     Left Ear: Tympanic membrane, ear canal and external ear normal. There is no impacted cerumen.     Nose: Nose normal. No nasal deformity, congestion or rhinorrhea.     Mouth/Throat:     Lips: Pink. No lesions.     Mouth: Mucous membranes are moist.     Pharynx: Oropharynx is clear. No oropharyngeal exudate or posterior oropharyngeal erythema.  Eyes:     General: Gaze aligned appropriately. No scleral icterus.       Right eye: No discharge.        Left eye: No discharge.     Conjunctiva/sclera: Conjunctivae normal.     Right eye: Right conjunctiva is not injected. No exudate or hemorrhage.    Left eye: Left conjunctiva is not injected. No exudate or hemorrhage. Neck:     Comments: Left sided lymphadenopathy extremely tender to touch Cardiovascular:     Rate and Rhythm: Normal rate and regular rhythm.  Pulses: Normal pulses.     Heart sounds: Normal heart sounds. No murmur heard.    No friction rub. No gallop.  Pulmonary:     Effort: Pulmonary effort is normal. No respiratory distress.     Breath sounds: Normal breath sounds. No stridor. No wheezing, rhonchi or rales.  Chest:     Chest wall: No tenderness.  Abdominal:     General: Abdomen is flat. There is no distension.     Palpations: Abdomen is soft.     Tenderness: There is no abdominal tenderness. There is no guarding or rebound.  Musculoskeletal:     Cervical back: Tenderness present.     Right lower leg: No edema.     Left lower leg: No edema.  Lymphadenopathy:     Cervical: Cervical adenopathy present.  Skin:    General: Skin is warm and dry.   Neurological:     Mental Status: She is alert and oriented to person, place, and time.  Psychiatric:        Mood and Affect: Mood normal.        Speech: Speech normal.        Behavior: Behavior normal. Behavior is cooperative.     ED Results / Procedures / Treatments   Labs (all labs ordered are listed, but only abnormal results are displayed) Labs Reviewed  BASIC METABOLIC PANEL - Abnormal; Notable for the following components:      Result Value   Calcium 8.8 (*)    All other components within normal limits  CBC WITH DIFFERENTIAL/PLATELET - Abnormal; Notable for the following components:   WBC 3.9 (*)    Neutro Abs 1.6 (*)    All other components within normal limits  RESP PANEL BY RT-PCR (FLU A&B, COVID) [id number]  GROUP A STREP BY PCR    EKG None  Radiology CT Soft Tissue Neck W Contrast  Result Date: 10/29/2021 CLINICAL DATA:  Left-sided neck pain for 1 month EXAM: CT NECK WITH CONTRAST TECHNIQUE: Multidetector CT imaging of the neck was performed using the standard protocol following the bolus administration of intravenous contrast. RADIATION DOSE REDUCTION: This exam was performed according to the departmental dose-optimization program which includes automated exposure control, adjustment of the mA and/or kV according to patient size and/or use of iterative reconstruction technique. CONTRAST:  25mL OMNIPAQUE IOHEXOL 300 MG/ML  SOLN COMPARISON:  None Available. FINDINGS: Pharynx and larynx: The nasal cavity and nasopharynx are unremarkable. The oral cavity and oropharynx are unremarkable. The parapharyngeal spaces are clear. The hypopharynx and larynx are unremarkable. The vocal folds are normal in appearance. There is no retropharyngeal fluid collection. There is no abnormal soft tissue mass abnormal enhancement, or fluid collection. Salivary glands: The parotid and submandibular glands are unremarkable. Thyroid: Unremarkable. Lymph nodes: There are numerous prominent left-sided  cervical chain lymph nodes with the largest node measuring up to 1.6 cm in short axis at level II A. This largest node demonstrates internal cystic change/necrosis. Additional prominent level IIB, III, and IV lymph nodes are seen. No enlarged or suspicious lymph nodes are seen on the right. Vascular: Major vasculature of the neck is unremarkable. Limited intracranial: The imaged portions of the intracranial compartment are unremarkable. Visualized orbits: The imaged globes and orbits are unremarkable. Mastoids and visualized paranasal sinuses: Clear. Skeleton: There is no acute osseous abnormality or suspicious osseous lesion. Upper chest: The imaged lung apices are clear. Other: There are extensive periapical lucencies and dental caries. IMPRESSION: 1. Left cervical lymphadenopathy with the  largest node measuring up to 1.6 cm in short axis and demonstrating internal cystic change/necrosis. Findings raise suspicion for occult malignancy with metastatic nodal disease with possible differential consideration including infective/suppurative lymphadenitis. Recommend ENT consultation for tissue sampling. 2. Extensive dental disease. Electronically Signed   By: Lesia Hausen M.D.   On: 10/29/2021 12:56    Procedures Procedures   Medications Ordered in ED Medications  sodium chloride (PF) 0.9 % injection (has no administration in time range)  ketorolac (TORADOL) 30 MG/ML injection 15 mg (15 mg Intravenous Given 10/29/21 1138)  iohexol (OMNIPAQUE) 300 MG/ML solution 100 mL (75 mLs Intravenous Contrast Given 10/29/21 1227)    ED Course/ Medical Decision Making/ A&P                            Medical Decision Making Amount and/or Complexity of Data Reviewed Labs: ordered. Radiology: ordered.  Risk Prescription drug management.   Patient being seen here for left sided neck swelling. Her vitals are stable. Due to extreme tenderness to palpation, I elected to obtain labs and CT imaging of her neck. She was  found to have mild neutropenia which is new, but no anemia or thrombocytopenia. BMP is reassuring. COVID, Flu, and strep negative. CT imaging revealed left cervical lymphadenopathy with the largest node measuring 1.6 cm and with internal cystic change and necrosis. This finding is suspicious for possible malignancy, but also could be infective/suppurative lymphadenitis. I have discussed this case with Interventional Radiology and spoke with Dr. Deanne Coffer who has agreed to do lymph node biopsy on an outpatient basis. I have placed these appropriate orders. I discussed results with patient and decided to try a course of antibiotics for possible lymphangitis. She has a PCP visit next week to establish care with Dr. Delford Field who should be able to follow up on all of her results. She is given strict return precautions with worsening symptoms.   Final Clinical Impression(s) / ED Diagnoses Final diagnoses:  Cervical lymphadenopathy    Rx / DC Orders ED Discharge Orders          Ordered    [place] CORE BIOPSY (LYMPH NODES)  Status:  Canceled        10/29/21 1353    [place] CORE BIOPSY (LYMPH NODES)       Comments: She has left sided lymphadenopathy that will need to be biopsied.  Her PCP is Shan Levans at [facility]. This is the provider who the results will followed up on.  Raelene Bott has agreed to do biopsy outpatient.   Please call patient at [phone] for scheduling   10/29/21 1410    amoxicillin-clavulanate (AUGMENTIN) 875-125 MG tablet  Every 12 hours        10/29/21 1415              Argelio Granier, Finis Bud, PA-C 10/29/21 1518    Derwood Kaplan, MD 11/03/21 [phone]

## 2021-10-29 NOTE — Discharge Instructions (Addendum)
Your CT was concerning for left cervical lymphadenopathy with possible necrosis or infection. I have placed a referral for you to have the lymph  nodes biopsied to find out what is causing this. You should be called to set up this appointment. Please call them if you do not hear anything. In the meanwhile I have prescribed you a course of antibiotics that may treat this. Please start taking today.   You should continue to follow up with Dr. Joya Gaskins and discuss these findings with him to facilitate any further workup needs or referrals.

## 2021-10-29 NOTE — ED Triage Notes (Signed)
Pt arrived via POV, states she has a "lump' in neck, for about 2 months, sore throat, has been seen and no definitive dx.

## 2021-10-31 NOTE — Progress Notes (Signed)
   New Patient Office Visit  Subjective    Patient ID: Olivia Giles, female    DOB: 07/13/1980  Age: 41 y.o. MRN: [id number]  CC: No chief complaint on file.   HPI Olivia Giles [facility] presents to establish care ***  Outpatient Encounter Medications as of 11/02/2021  Medication Sig  . albuterol (VENTOLIN HFA) 108 (90 Base) MCG/ACT inhaler Inhale 1-2 puffs into the lungs every 6 (six) hours as needed for wheezing or shortness of breath.  Marland Kitchen amoxicillin-clavulanate (AUGMENTIN) 875-125 MG tablet Take 1 tablet by mouth every 12 (twelve) hours for 10 days.  . fluticasone (FLONASE) 50 MCG/ACT nasal spray Place 2 sprays into both nostrils daily.  . [DISCONTINUED] promethazine (PHENERGAN) 12.5 MG tablet Take 1 tablet (12.5 mg total) by mouth every 6 (six) hours as needed for nausea or vomiting. (Patient not taking: No sig reported)   No facility-administered encounter medications on file as of 11/02/2021.    Past Medical History:  Diagnosis Date  . Asthma   . Bronchitis     Past Surgical History:  Procedure Laterality Date  . NO PAST SURGERIES      Family History  Problem Relation Age of Onset  . Hypertension Mother   . Hypertension Father   . Hypertension Sister     Social History   Socioeconomic History  . Marital status: Married    Spouse name: Not on file  . Number of children: Not on file  . Years of education: Not on file  . Highest education level: Not on file  Occupational History  . Not on file  Tobacco Use  . Smoking status: Former  . Smokeless tobacco: Never  Vaping Use  . Vaping Use: Never used  Substance and Sexual Activity  . Alcohol use: No  . Drug use: No  . Sexual activity: Yes    Birth control/protection: None  Other Topics Concern  . Not on file  Social History Narrative  . Not on file   Social Determinants of Health   Financial Resource Strain: Not on file  Food Insecurity: Not on file  Transportation Needs: Not on file  Physical Activity:  Not on file  Stress: Not on file  Social Connections: Not on file  Intimate Partner Violence: Not on file    ROS      Objective    LMP 10/19/2021 (Approximate)   Physical Exam  {Labs (Optional):23779}    Assessment & Plan:   Problem List Items Addressed This Visit   None   No follow-ups on file.   Asencion Noble, MD

## 2021-11-02 ENCOUNTER — Encounter: Payer: Self-pay | Admitting: Critical Care Medicine

## 2021-11-02 ENCOUNTER — Ambulatory Visit: Payer: PRIVATE HEALTH INSURANCE | Attending: Critical Care Medicine | Admitting: Critical Care Medicine

## 2021-11-02 VITALS — BP 116/81 | HR 98 | Temp 98.4°F | Ht 60.0 in | Wt 201.4 lb

## 2021-11-02 DIAGNOSIS — K089 Disorder of teeth and supporting structures, unspecified: Secondary | ICD-10-CM | POA: Diagnosis not present

## 2021-11-02 DIAGNOSIS — Z6839 Body mass index (BMI) 39.0-39.9, adult: Secondary | ICD-10-CM | POA: Insufficient documentation

## 2021-11-02 DIAGNOSIS — Z720 Tobacco use: Secondary | ICD-10-CM | POA: Diagnosis not present

## 2021-11-02 DIAGNOSIS — R591 Generalized enlarged lymph nodes: Secondary | ICD-10-CM

## 2021-11-02 DIAGNOSIS — Z139 Encounter for screening, unspecified: Secondary | ICD-10-CM | POA: Diagnosis not present

## 2021-11-02 MED ORDER — CLINDAMYCIN HCL 150 MG PO CAPS: 150.0000 mg | ORAL_CAPSULE | Freq: Three times a day (TID) | ORAL | 0 refills | Status: DC

## 2021-11-02 NOTE — Assessment & Plan Note (Signed)
Multiple screenings occurred

## 2021-11-02 NOTE — Patient Instructions (Signed)
Follow lifestyle medicine handout with regards to diet and also exercise  Focus on smoking cessation use a 14 mg nicotine patch  Referral to dentist Posey Pronto was made  Take clindamycin 3 times daily and finish Augmentin you are taking  Screening labs obtained today  Return for a Pap smear and recheck with one of our other providers in the next 2 months  See Dr. Joya Gaskins in 3 months

## 2021-11-02 NOTE — Assessment & Plan Note (Signed)

## 2021-11-02 NOTE — Progress Notes (Unsigned)
Olivia Keen, MD  Tamala Bari for [place] bx left neck adenopathy   Ts

## 2021-11-02 NOTE — Assessment & Plan Note (Signed)
As per lymphadenopathy

## 2021-11-02 NOTE — Assessment & Plan Note (Signed)
Suspect from dental disease we will add clindamycin 150 mg 3 times daily and referred to dental

## 2021-11-02 NOTE — Assessment & Plan Note (Signed)
Patient is actively smoking     . Current smoking consumption amount: 2 packs a week  . Dicsussion on advise to quit smoking and smoking impacts: Cardiovascular impacts  . Patient's willingness to quit: Wants to quit  . Methods to quit smoking discussed: Behavioral modification nicotine replacement  . Medication management of smoking session drugs discussed: Nicotine patch  . Resources provided:  AVS   . Setting quit date not established  . Follow-up arranged 4 months   Time spent counseling the patient: 5 minutes

## 2021-11-03 ENCOUNTER — Ambulatory Visit: Payer: Self-pay | Admitting: Nurse Practitioner

## 2021-11-04 ENCOUNTER — Telehealth: Payer: Self-pay | Admitting: Critical Care Medicine

## 2021-11-04 ENCOUNTER — Encounter: Payer: Self-pay | Admitting: Critical Care Medicine

## 2021-11-04 ENCOUNTER — Other Ambulatory Visit: Payer: Self-pay

## 2021-11-04 ENCOUNTER — Emergency Department (HOSPITAL_COMMUNITY): Payer: PRIVATE HEALTH INSURANCE

## 2021-11-04 ENCOUNTER — Emergency Department (HOSPITAL_COMMUNITY)
Admission: EM | Admit: 2021-11-04 | Discharge: 2021-11-04 | Disposition: A | Discharge status: Home / Self Care | Payer: PRIVATE HEALTH INSURANCE | Attending: Emergency Medicine | Admitting: Emergency Medicine

## 2021-11-04 ENCOUNTER — Ambulatory Visit: Payer: Self-pay

## 2021-11-04 DIAGNOSIS — B2700 Gammaherpesviral mononucleosis without complication: Secondary | ICD-10-CM | POA: Insufficient documentation

## 2021-11-04 DIAGNOSIS — R1110 Vomiting, unspecified: Secondary | ICD-10-CM | POA: Insufficient documentation

## 2021-11-04 DIAGNOSIS — R079 Chest pain, unspecified: Secondary | ICD-10-CM | POA: Insufficient documentation

## 2021-11-04 DIAGNOSIS — Z5321 Procedure and treatment not carried out due to patient leaving prior to being seen by health care provider: Secondary | ICD-10-CM | POA: Insufficient documentation

## 2021-11-04 LAB — TROPONIN I (HIGH SENSITIVITY)
Troponin I (High Sensitivity): <2 ng/L (ref <18)
Troponin I (High Sensitivity): <2 ng/L (ref <18)

## 2021-11-04 LAB — CBC WITH DIFFERENTIAL/PLATELET
Abs Immature Granulocytes: 0.04 10*3/uL (ref 0.00–0.07)
Basophils Absolute: 0 10*3/uL (ref 0.0–0.1)
Basophils Relative: 0 %
Eosinophils Absolute: 0 10*3/uL (ref 0.0–0.5)
Eosinophils Relative: 0 %
HCT: 36.4 % (ref 36.0–46.0)
Hemoglobin: 12.1 g/dL (ref 12.0–15.0)
Immature Granulocytes: 1 %
Lymphocytes Relative: 45 %
Lymphs Abs: 2 10*3/uL (ref 0.7–4.0)
MCH: 29.7 pg (ref 26.0–34.0)
MCHC: 33.2 g/dL (ref 30.0–36.0)
MCV: 89.4 fL (ref 80.0–100.0)
Monocytes Absolute: 0.5 10*3/uL (ref 0.1–1.0)
Monocytes Relative: 11 %
Neutro Abs: 1.9 10*3/uL (ref 1.7–7.7)
Neutrophils Relative %: 43 %
Platelets: 303 10*3/uL (ref 150–400)
RBC: 4.07 MIL/uL (ref 3.87–5.11)
RDW: 13.1 % (ref 11.5–15.5)
WBC: 4.4 10*3/uL (ref 4.0–10.5)
nRBC: 0 % (ref 0.0–0.2)

## 2021-11-04 LAB — HEPATIC FUNCTION PANEL
ALT: 22 IU/L (ref 0–32)
AST: 20 IU/L (ref 0–40)
Albumin: 3.9 g/dL (ref 3.9–4.9)
Alkaline Phosphatase: 64 IU/L (ref 44–121)
Bilirubin Total: 0.3 mg/dL (ref 0.0–1.2)
Bilirubin, Direct: 0.11 mg/dL (ref 0.00–0.40)
Total Protein: 7.2 g/dL (ref 6.0–8.5)

## 2021-11-04 LAB — HEMOGLOBIN A1C
Est. average glucose Bld gHb Est-mCnc: 128 mg/dL
Hgb A1c MFr Bld: 6.1 % — ABNORMAL HIGH (ref 4.8–5.6)

## 2021-11-04 LAB — COMPREHENSIVE METABOLIC PANEL
ALT: 27 U/L (ref 0–44)
AST: 24 U/L (ref 15–41)
Albumin: 3.4 g/dL — ABNORMAL LOW (ref 3.5–5.0)
Alkaline Phosphatase: 53 U/L (ref 38–126)
Anion gap: 4 — ABNORMAL LOW (ref 5–15)
BUN: 5 mg/dL — ABNORMAL LOW (ref 6–20)
CO2: 23 mmol/L (ref 22–32)
Calcium: 8.8 mg/dL — ABNORMAL LOW (ref 8.9–10.3)
Chloride: 108 mmol/L (ref 98–111)
Creatinine, Ser: 0.72 mg/dL (ref 0.44–1.00)
GFR, Estimated: >60 mL/min (ref >60)
Glucose, Bld: 92 mg/dL (ref 70–99)
Potassium: 4.1 mmol/L (ref 3.5–5.1)
Sodium: 135 mmol/L (ref 135–145)
Total Bilirubin: 0.6 mg/dL (ref 0.3–1.2)
Total Protein: 7.7 g/dL (ref 6.5–8.1)

## 2021-11-04 LAB — LIPASE, BLOOD: Lipase: 34 U/L (ref 11–51)

## 2021-11-04 LAB — LIPID PANEL
Chol/HDL Ratio: 2.9 ratio (ref 0.0–4.4)
Cholesterol, Total: 124 mg/dL (ref 100–199)
HDL: 43 mg/dL (ref >39)
LDL Chol Calc (NIH): 61 mg/dL (ref 0–99)
Triglycerides: 111 mg/dL (ref 0–149)
VLDL Cholesterol Cal: 20 mg/dL (ref 5–40)

## 2021-11-04 LAB — HCV INTERPRETATION

## 2021-11-04 LAB — HCV AB W REFLEX TO QUANT PCR: HCV Ab: NONREACTIVE

## 2021-11-04 LAB — EPSTEIN-BARR VIRUS NUCLEAR ANTIGEN ANTIBODY, IGG: EBV NA IgG: >600 U/mL — ABNORMAL HIGH (ref 0.0–17.9)

## 2021-11-04 MED ORDER — ALUM & MAG HYDROXIDE-SIMETH 200-200-20 MG/5ML PO SUSP: 30.0000 mL | Freq: Once | ORAL | Status: DC

## 2021-11-04 NOTE — Telephone Encounter (Signed)
  Chief Complaint: Chest pain, vomiting, dizziness Symptoms: ibid Frequency: this morning Pertinent Negatives: Patient denies SOB Disposition: [x] ED /[] Urgent Care (no appt availability in office) / [] Appointment(In office/virtual)/ []  [place] [facility]/ [] Home Care/ [] Refused Recommended Disposition /[] [place] [facility]/ []  Follow-up with PCP Additional Notes: PT has right sided intermittent chest pain starting this morning. Pain last about 10 minutes, pt rates this pain as 9/10.She also has dizziness, and vomiting.     Reason for Disposition  [1] Chest pain lasts > 5 minutes AND [2] occurred in past 3 days (72 hours) (Exception: Feels exactly the same as previously diagnosed heartburn and has accompanying sour taste in mouth.)  Answer Assessment - Initial Assessment Questions 1. LOCATION: "Where does it hurt?"       Right side middle 2. RADIATION: "Does the pain go anywhere else?" (e.g., into neck, jaw, arms, back)     no 3. ONSET: "When did the chest pain begin?" (Minutes, hours or days)      This morning 4. PATTERN: "Does the pain come and go, or has it been constant since it started?"  "Does it get worse with exertion?"      Comes and goes this morning  5. DURATION: "How long does it last" (e.g., seconds, minutes, hours)     10 minutes 6. SEVERITY: "How bad is the pain?"  (e.g., Scale 1-10; mild, moderate, or severe)    - MILD (1-3): doesn't interfere with normal activities     - MODERATE (4-7): interferes with normal activities or awakens from sleep    - SEVERE (8-10): excruciating pain, unable to do any normal activities       9/10 7. CARDIAC RISK FACTORS: "Do you have any history of heart problems or risk factors for heart disease?" (e.g., angina, prior heart attack; diabetes, high blood pressure, high cholesterol, smoker, or strong family history of heart disease)     no 8. PULMONARY RISK FACTORS: "Do you have any history of lung disease?"  (e.g., blood clots in  lung, asthma, emphysema, birth control pills)     Asthma, bronchitis 9. CAUSE: "What do you think is causing the chest pain?"     unsure 10. OTHER SYMPTOMS: "Do you have any other symptoms?" (e.g., dizziness, nausea, vomiting, sweating, fever, difficulty breathing, cough)       Dizziness, vomiting 11. PREGNANCY: "Is there any chance you are pregnant?" "When was your last menstrual period?"       no  Protocols used: Chest Pain-A-AH

## 2021-11-04 NOTE — Telephone Encounter (Signed)
Will produce letter for work

## 2021-11-04 NOTE — ED Notes (Signed)
Pt no longer wanted to wait pt left the hospital.  

## 2021-11-04 NOTE — Progress Notes (Signed)
Pt aware of results 

## 2021-11-04 NOTE — ED Provider Triage Note (Signed)
Emergency Medicine Provider Triage Evaluation Note  Olivia Giles , a 41 y.o. female  was evaluated in triage.  Pt complains of chest pain.  Patient reports that she was placed on clindamycin for an infection by her primary care provider.  Patient reports that after taking the antibiotics this morning she had an episode of vomiting.  Reports that she had central chest pain that started right after this episode of vomiting and has been intermittent since then.  She reports some associated lightheadedness as well.  No shortness of breath, no syncope.  No abdominal pain and no further vomiting.  Review of Systems  Positive: Chest pain, lightheadedness Negative: Fever, cough, shortness of breath, abdominal pain  Physical Exam  BP 100/64 (BP Location: Right Arm)   Pulse 95   Temp 98.6 F (37 C) (Oral)   Resp 16   Ht 5' (1.524 m)   Wt 91.2 kg   LMP 10/19/2021 (Approximate)   SpO2 100%   BMI 39.26 kg/m  Gen:   Awake, no distress   Resp:  Normal effort, CTA bilateral, RRR MSK:   Moves extremities without difficulty  Other:    Medical Decision Making  Medically screening exam initiated at 10:33 AM.  Appropriate orders placed.  Olivia Olivia Giles was informed that the remainder of the evaluation will be completed by another provider, this initial triage assessment does not replace that evaluation, and the importance of remaining in the ED until their evaluation is complete.     Olivia Giles, [place] 11/04/21 1036

## 2021-11-04 NOTE — ED Triage Notes (Signed)
Pt. Stated, I had some chest pain after I vomited this morning.

## 2021-11-05 ENCOUNTER — Ambulatory Visit (HOSPITAL_COMMUNITY): Payer: PRIVATE HEALTH INSURANCE

## 2021-11-10 ENCOUNTER — Ambulatory Visit: Payer: Self-pay

## 2021-11-10 NOTE — Telephone Encounter (Signed)
Copied from [place]. Topic: Referral - Status >> Nov 10, 2021  9:17 AM Chapman Fitch wrote: Reason for CRM: Please advise if Dental referral is still good to use/ please advise

## 2021-11-10 NOTE — Telephone Encounter (Signed)
  Chief Complaint: Headaches  Symptoms: headaches that are like migraine, 10/10 when present, eye pain when eyes opened wide and neck pain  Frequency: 2 months  Pertinent Negatives: Patient denies blurred vision Disposition: [] ED /[] Urgent Care (no appt availability in office) / [] Appointment(In office/virtual)/ []  [place] [facility]/ [x] Home Care/ [] Refused Recommended Disposition /[] [place] [facility]/ []  Follow-up with PCP Additional Notes: pt was calling to just let Dr. Joya Gaskins know about HA since she forgot to mention when had OV on 11/02/21. She takes Excredin Migraine and Tylenol ES as needed and is effective. Pt has scheduled fu appt in Dec and advised if sx get worse or feels like she wants to come in sooner for sx to just call back. Pt verbalized understanding and declined scheduling sooner appt right now.   Summary: head pain when eyes are wide   Pt keeps having headaches and she forgot to tell Dr. Joya Gaskins during her last appt / she states her head hurts when she widens her eyes / please advise      Reason for Disposition  Headache is a chronic symptom (recurrent or ongoing AND present > 4 weeks)  Answer Assessment - Initial Assessment Questions 1. LOCATION: "Where does it hurt?"     Whole head, no particular spot 2. ONSET: "When did the headache start?" (Minutes, hours or days)      2 months  3. PATTERN: "Does the pain come and go, or has it been constant since it started?"     Comes and goes  4. SEVERITY: "How bad is the pain?" and "What does it keep you from doing?"  (e.g., Scale 1-10; mild, moderate, or severe)   - MILD (1-3): doesn't interfere with normal activities    - MODERATE (4-7): interferes with normal activities or awakens from sleep    - SEVERE (8-10): excruciating pain, unable to do any normal activities        10 when present  9. OTHER SYMPTOMS: "Do you have any other symptoms?" (fever, stiff neck, eye pain, sore throat, cold symptoms)     Eye pain when  HA present, neck pain  Protocols used: Headache-A-AH

## 2021-11-11 ENCOUNTER — Encounter (HOSPITAL_COMMUNITY): Payer: Self-pay

## 2021-11-11 ENCOUNTER — Ambulatory Visit (HOSPITAL_COMMUNITY)
Admission: EM | Admit: 2021-11-11 | Discharge: 2021-11-11 | Disposition: A | Discharge status: Home / Self Care | Payer: PRIVATE HEALTH INSURANCE | Attending: Internal Medicine | Admitting: Internal Medicine

## 2021-11-11 ENCOUNTER — Encounter (HOSPITAL_BASED_OUTPATIENT_CLINIC_OR_DEPARTMENT_OTHER): Payer: PRIVATE HEALTH INSURANCE | Admitting: Physician Assistant

## 2021-11-11 DIAGNOSIS — L270 Generalized skin eruption due to drugs and medicaments taken internally: Secondary | ICD-10-CM | POA: Insufficient documentation

## 2021-11-11 DIAGNOSIS — L03818 Cellulitis of other sites: Secondary | ICD-10-CM | POA: Diagnosis not present

## 2021-11-11 LAB — COMPREHENSIVE METABOLIC PANEL
ALT: 61 U/L — ABNORMAL HIGH (ref 0–44)
AST: 34 U/L (ref 15–41)
Albumin: 3.3 g/dL — ABNORMAL LOW (ref 3.5–5.0)
Alkaline Phosphatase: 58 U/L (ref 38–126)
Anion gap: 12 (ref 5–15)
BUN: 5 mg/dL — ABNORMAL LOW (ref 6–20)
CO2: 24 mmol/L (ref 22–32)
Calcium: 9 mg/dL (ref 8.9–10.3)
Chloride: 101 mmol/L (ref 98–111)
Creatinine, Ser: 0.78 mg/dL (ref 0.44–1.00)
GFR, Estimated: >60 mL/min (ref >60)
Glucose, Bld: 78 mg/dL (ref 70–99)
Potassium: 4.4 mmol/L (ref 3.5–5.1)
Sodium: 137 mmol/L (ref 135–145)
Total Bilirubin: 0.5 mg/dL (ref 0.3–1.2)
Total Protein: 7.6 g/dL (ref 6.5–8.1)

## 2021-11-11 LAB — CBC WITH DIFFERENTIAL/PLATELET
Abs Immature Granulocytes: 0.03 10*3/uL (ref 0.00–0.07)
Basophils Absolute: 0 10*3/uL (ref 0.0–0.1)
Basophils Relative: 0 %
Eosinophils Absolute: 0 10*3/uL (ref 0.0–0.5)
Eosinophils Relative: 0 %
HCT: 33.2 % — ABNORMAL LOW (ref 36.0–46.0)
Hemoglobin: 11.5 g/dL — ABNORMAL LOW (ref 12.0–15.0)
Immature Granulocytes: 1 %
Lymphocytes Relative: 39 %
Lymphs Abs: 0.9 10*3/uL (ref 0.7–4.0)
MCH: 31.7 pg (ref 26.0–34.0)
MCHC: 34.6 g/dL (ref 30.0–36.0)
MCV: 91.5 fL (ref 80.0–100.0)
Monocytes Absolute: 0.1 10*3/uL (ref 0.1–1.0)
Monocytes Relative: 5 %
Neutro Abs: 1.3 10*3/uL — ABNORMAL LOW (ref 1.7–7.7)
Neutrophils Relative %: 55 %
Platelets: 259 10*3/uL (ref 150–400)
RBC: 3.63 MIL/uL — ABNORMAL LOW (ref 3.87–5.11)
RDW: 13.4 % (ref 11.5–15.5)
WBC: 2.4 10*3/uL — ABNORMAL LOW (ref 4.0–10.5)
nRBC: 1.7 % — ABNORMAL HIGH (ref 0.0–0.2)

## 2021-11-11 LAB — POCT URINALYSIS DIPSTICK, ED / UC
Bilirubin Urine: NEGATIVE
Glucose, UA: NEGATIVE mg/dL
Ketones, ur: NEGATIVE mg/dL
Leukocytes,Ua: NEGATIVE
Nitrite: NEGATIVE
Protein, ur: NEGATIVE mg/dL
Specific Gravity, Urine: 1.015 (ref 1.005–1.030)
Urobilinogen, UA: 0.2 mg/dL (ref 0.0–1.0)
pH: 6.5 (ref 5.0–8.0)

## 2021-11-11 LAB — POCT RAPID STREP A, ED / UC: Streptococcus, Group A Screen (Direct): NEGATIVE

## 2021-11-11 MED ORDER — HYDROXYZINE HCL 50 MG PO TABS: 50.0000 mg | ORAL_TABLET | Freq: Four times a day (QID) | ORAL | 0 refills | Status: DC | PRN

## 2021-11-11 MED ORDER — PREDNISONE 10 MG (21) PO TBPK: ORAL_TABLET | Freq: Every day | ORAL | 0 refills | Status: AC

## 2021-11-11 MED ORDER — ACETAMINOPHEN 325 MG PO TABS
ORAL_TABLET | ORAL | Status: AC
  Filled 2021-11-11: qty 2

## 2021-11-11 MED ORDER — ACETAMINOPHEN 325 MG PO TABS
650.0000 mg | ORAL_TABLET | Freq: Once | ORAL | Status: AC
  Administered 2021-11-11: 650 mg via ORAL

## 2021-11-11 MED ORDER — HYDROXYZINE HCL 50 MG PO TABS: 25.0000 mg | ORAL_TABLET | Freq: Four times a day (QID) | ORAL | 0 refills | Status: DC | PRN

## 2021-11-11 MED ORDER — DEXAMETHASONE SODIUM PHOSPHATE 10 MG/ML IJ SOLN
INTRAMUSCULAR | Status: AC
  Filled 2021-11-11: qty 1

## 2021-11-11 MED ORDER — DEXAMETHASONE SODIUM PHOSPHATE 10 MG/ML IJ SOLN
10.0000 mg | Freq: Once | INTRAMUSCULAR | Status: AC
  Administered 2021-11-11: 10 mg via INTRAMUSCULAR

## 2021-11-11 NOTE — Discharge Instructions (Addendum)
Please take medications as prescribed If you have oral ulcerations or eye redness/burning or burning on urination-please go to the emergency room to be evaluated We will call you with recommendations if labs are abnormal.

## 2021-11-11 NOTE — ED Triage Notes (Signed)
Patient has a rash all over. Also has a knot behind the left ear that came out of no where.  Rash started on her arms and ears last night then spread. States the rash itches.  No new products or dietary changes. Patient states was given antibiotics for swollen lymph nodes. Started these meds last week and finished on Tuesday.   Patient states has been having headaches for the past few days. No nausea or vinson changes. No medical history of migraines.

## 2021-11-12 NOTE — ED Provider Notes (Signed)
[facility]    CSN: [id number] Arrival date & time: 11/11/21  1516      History   Chief Complaint Chief Complaint  Patient presents with   Rash   Headache    HPI Olivia Giles is a 41 y.o. female comes to the urgent care with 3-day history of itchy rash all over her body.  Patient recently completed a course of clindamycin and has previously completed a course of Augmentin about 3 weeks ago.  She was prescribed those medications for infectious cervical lymphadenitis.  Patient denies any change in cosmetics, soaps or body lotions.  She has had a headache and low-grade fever since the rash started.  No nausea or vomiting.  No sore throat.  No oral ulcers or burning sensation in her eyes.  No dysuria urgency or frequency.  She has some joint discomfort mainly in both hands.  No joint swelling.  No diarrhea.  No changes in diet. 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 Authorizing Provider  predniSONE (STERAPRED UNI-PAK 21 TAB) 10 MG (21) TBPK tablet Take by mouth daily. Take 6 tabs by mouth daily  for 2 days, then 5 tabs for 2 days, then 4 tabs for 2 days, then 3 tabs for 2 days, 2 tabs for 2 days, then 1 tab by mouth daily for 2 days 11/11/21  Yes Dearius Hoffmann, Britta Mccreedy, MD  albuterol (VENTOLIN HFA) 108  (90 Base) MCG/ACT inhaler Inhale 1-2 puffs into the lungs every 6 (six) hours as needed for wheezing or shortness of breath. 06/10/20   Rushie Chestnut, PA-C  hydrOXYzine (ATARAX) 50 MG tablet Take 1 tablet (50 mg total) by mouth every 6 (six) hours as needed. 11/11/21   Colston Pyle, Britta Mccreedy, MD  promethazine (PHENERGAN) 12.5 MG tablet Take 1 tablet (12.5 mg total) by mouth every 6 (six) hours as needed for nausea or vomiting. Patient not taking: No sig reported 08/16/16 05/15/20  Raelyn Mora, CNM    Family History Family History  Problem Relation Age of Onset   COPD Mother    Hypertension Mother    Hypertension Father    Hypertension Sister     Social History Social History   Tobacco Use   Smoking status: Some Days    Packs/day: 0.25    Years: 2.00    Total pack years: 0.50    Types: Cigarettes   Smokeless tobacco: Never  Vaping Use   Vaping Use: Never used  Substance Use Topics  Alcohol use: No   Drug use: No     Allergies   Bee venom   Review of Systems Review of Systems  HENT: Negative.    Eyes: Negative.   Respiratory: Negative.    Cardiovascular: Negative.   Gastrointestinal: Negative.   Musculoskeletal:  Positive for arthralgias. Negative for joint swelling and myalgias.  Skin:  Positive for color change and rash.  Hematological:  Positive for adenopathy.  Psychiatric/Behavioral: Negative.       Physical Exam Triage Vital Signs ED Triage Vitals  Enc Vitals Group     BP 11/11/21 1619 119/68     Pulse Rate 11/11/21 1619 (!) 105     Resp 11/11/21 1619 16     Temp 11/11/21 1619 100.1 F (37.8 C)     Temp src --      SpO2 11/11/21 1619 100 %     Weight --      Height --      Head Circumference --      Peak Flow --      Pain Score 11/11/21 1618 7     Pain Loc --      Pain Edu? --      Excl. in GC? --    No data found.  Updated Vital Signs BP 119/68 (BP Location: Right Arm)   Pulse (!) 105   Temp 100.1 F (37.8 C)   Resp 16   LMP  11/10/2021 (Approximate)   SpO2 100%   Visual Acuity Right Eye Distance:   Left Eye Distance:   Bilateral Distance:    Right Eye Near:   Left Eye Near:    Bilateral Near:     Physical Exam Vitals and nursing note reviewed.  Constitutional:      General: She is in acute distress.  Eyes:     General: No scleral icterus.    Extraocular Movements: Extraocular movements intact.     Pupils: Pupils are equal, round, and reactive to light. Pupils are equal.  Cardiovascular:     Rate and Rhythm: Normal rate and regular rhythm.  Pulmonary:     Effort: Pulmonary effort is normal.     Breath sounds: Normal breath sounds.  Abdominal:     General: Bowel sounds are normal. There is no distension.     Palpations: Abdomen is soft.     Tenderness: There is no abdominal tenderness.  Musculoskeletal:     Cervical back: Normal range of motion.  Lymphadenopathy:     Cervical: Cervical adenopathy present.  Skin:    General: Skin is warm.     Comments: Generalized maculopapular rash involving face, chest, back both upper and lower extremities.  Oral mucosa is without ulceration.  There is no conjunctival erythema or injection.  Neurological:     Mental Status: She is alert.     GCS: GCS eye subscore is 4. GCS verbal subscore is 5. GCS motor subscore is 6.      UC Treatments / Results  Labs (all labs ordered are listed, but only abnormal results are displayed) Labs Reviewed  CBC WITH DIFFERENTIAL/PLATELET - Abnormal; Notable for the following components:      Result Value   WBC 2.4 (*)    RBC 3.63 (*)    Hemoglobin 11.5 (*)    HCT 33.2 (*)    nRBC 1.7 (*)    Neutro Abs 1.3 (*)    All other components within normal limits  COMPREHENSIVE METABOLIC PANEL - Abnormal; Notable for the  following components:   BUN 5 (*)    Albumin 3.3 (*)    ALT 61 (*)    All other components within normal limits  POCT URINALYSIS DIPSTICK, ED / UC - Abnormal; Notable for the following components:   Hgb  urine dipstick LARGE (*)    All other components within normal limits  POCT RAPID STREP A, ED / UC    EKG   Radiology No results found.  Procedures Procedures (including critical care time)  Medications Ordered in UC Medications  dexamethasone (DECADRON) injection 10 mg (10 mg Intramuscular Given 11/11/21 1711)  acetaminophen (TYLENOL) tablet 650 mg (650 mg Oral Given 11/11/21 1711)    Initial Impression / Assessment and Plan / UC Course  I have reviewed the triage vital signs and the nursing notes.  Pertinent labs & imaging results that were available during my care of the patient were reviewed by me and considered in my medical decision making (see chart for details).     1.  Drug rash: CBC, CMP Point-of-care strep is negative Throat cultures have been sent Point-of-care urinalysis is significant for hemoglobin-patient is currently menstruating. Dexamethasone 10 mg IM x1 dose Prednisone taper Hydroxyzine as needed for itching Patient is advised to return to urgent care to be reevaluated on Monday 10/23 for improvement Return precautions given. Final Clinical Impressions(s) / UC Diagnoses   Final diagnoses:  Drug rash     Discharge Instructions      Please take medications as prescribed If you have oral ulcerations or eye redness/burning or burning on urination-please go to the emergency room to be evaluated We will call you with recommendations if labs are abnormal.   ED Prescriptions     Medication Sig Dispense Auth. Provider   predniSONE (STERAPRED UNI-PAK 21 TAB) 10 MG (21) TBPK tablet Take by mouth daily. Take 6 tabs by mouth daily  for 2 days, then 5 tabs for 2 days, then 4 tabs for 2 days, then 3 tabs for 2 days, 2 tabs for 2 days, then 1 tab by mouth daily for 2 days 42 tablet Noele Icenhour, Myrene Galas, MD   hydrOXYzine (ATARAX) 50 MG tablet  (Status: Discontinued) Take 0.5 tablets (25 mg total) by mouth every 6 (six) hours as needed. 30 tablet Adell Panek, Myrene Galas, MD   hydrOXYzine (ATARAX) 50 MG tablet Take 1 tablet (50 mg total) by mouth every 6 (six) hours as needed. 30 tablet Derald Lorge, Myrene Galas, MD      PDMP not reviewed this encounter.   Chase Picket, MD 11/12/21 1056

## 2021-11-13 ENCOUNTER — Other Ambulatory Visit: Payer: Self-pay

## 2021-11-13 ENCOUNTER — Encounter (HOSPITAL_COMMUNITY): Payer: Self-pay | Admitting: *Deleted

## 2021-11-13 ENCOUNTER — Ambulatory Visit (HOSPITAL_COMMUNITY)
Admission: EM | Admit: 2021-11-13 | Discharge: 2021-11-13 | Disposition: A | Discharge status: Home / Self Care | Payer: PRIVATE HEALTH INSURANCE | Attending: Emergency Medicine | Admitting: Emergency Medicine

## 2021-11-13 DIAGNOSIS — R21 Rash and other nonspecific skin eruption: Secondary | ICD-10-CM | POA: Diagnosis present

## 2021-11-13 LAB — COMPREHENSIVE METABOLIC PANEL
ALT: 160 U/L — ABNORMAL HIGH (ref 0–44)
AST: 126 U/L — ABNORMAL HIGH (ref 15–41)
Albumin: 3.5 g/dL (ref 3.5–5.0)
Alkaline Phosphatase: 60 U/L (ref 38–126)
Anion gap: 13 (ref 5–15)
BUN: 9 mg/dL (ref 6–20)
CO2: 23 mmol/L (ref 22–32)
Calcium: 9.4 mg/dL (ref 8.9–10.3)
Chloride: 103 mmol/L (ref 98–111)
Creatinine, Ser: 0.72 mg/dL (ref 0.44–1.00)
GFR, Estimated: >60 mL/min (ref >60)
Glucose, Bld: 101 mg/dL — ABNORMAL HIGH (ref 70–99)
Potassium: 4.6 mmol/L (ref 3.5–5.1)
Sodium: 139 mmol/L (ref 135–145)
Total Bilirubin: 0.3 mg/dL (ref 0.3–1.2)
Total Protein: 7.8 g/dL (ref 6.5–8.1)

## 2021-11-13 LAB — CBC WITH DIFFERENTIAL/PLATELET
Abs Immature Granulocytes: 0.06 10*3/uL (ref 0.00–0.07)
Basophils Absolute: 0 10*3/uL (ref 0.0–0.1)
Basophils Relative: 0 %
Eosinophils Absolute: 0 10*3/uL (ref 0.0–0.5)
Eosinophils Relative: 0 %
HCT: 35.9 % — ABNORMAL LOW (ref 36.0–46.0)
Hemoglobin: 12.4 g/dL (ref 12.0–15.0)
Immature Granulocytes: 1 %
Lymphocytes Relative: 12 %
Lymphs Abs: 0.8 10*3/uL (ref 0.7–4.0)
MCH: 31.6 pg (ref 26.0–34.0)
MCHC: 34.5 g/dL (ref 30.0–36.0)
MCV: 91.3 fL (ref 80.0–100.0)
Monocytes Absolute: 0.3 10*3/uL (ref 0.1–1.0)
Monocytes Relative: 4 %
Neutro Abs: 5.1 10*3/uL (ref 1.7–7.7)
Neutrophils Relative %: 83 %
Platelets: 302 10*3/uL (ref 150–400)
RBC: 3.93 MIL/uL (ref 3.87–5.11)
RDW: 13.8 % (ref 11.5–15.5)
WBC: 6.2 10*3/uL (ref 4.0–10.5)
nRBC: 0.3 % — ABNORMAL HIGH (ref 0.0–0.2)

## 2021-11-13 LAB — HIV ANTIBODY (ROUTINE TESTING W REFLEX): HIV Screen 4th Generation wRfx: NONREACTIVE

## 2021-11-13 MED ORDER — FAMOTIDINE 20 MG PO TABS: 20.0000 mg | ORAL_TABLET | Freq: Two times a day (BID) | ORAL | 0 refills | Status: AC

## 2021-11-13 MED ORDER — FAMOTIDINE 20 MG PO TABS
20.0000 mg | ORAL_TABLET | Freq: Once | ORAL | Status: AC
  Administered 2021-11-13: 20 mg via ORAL

## 2021-11-13 MED ORDER — FAMOTIDINE 20 MG PO TABS
ORAL_TABLET | ORAL | Status: AC
  Filled 2021-11-13: qty 1

## 2021-11-13 MED ORDER — CETIRIZINE HCL 10 MG PO TABS: 10.0000 mg | ORAL_TABLET | Freq: Every day | ORAL | 0 refills | Status: AC

## 2021-11-13 NOTE — Discharge Instructions (Addendum)
Please discontinue hydroxyzine.  Please start taking Zyrtec 1 time daily.  Pepcid medication has been sent to the pharmacy, he can take this medication 2 times daily.  You can take 25 mg of Benadryl at nighttime, may purchase this over-the-counter.   We will call you if any of your test results are positive or warrant a change in therapy.   Have attached information for dermatologist to your paperwork if symptoms are not improving, I suggest you follow-up with dermatology.

## 2021-11-13 NOTE — ED Triage Notes (Signed)
PT was seen on 10-19 -23 for allergic reaction. Pt comes today because the redness and swelling has not improved since Thursday.

## 2021-11-13 NOTE — ED Provider Notes (Signed)
[facility]    CSN: [id number] Arrival date & time: 11/13/21  1504      History   Chief Complaint Chief Complaint  Patient presents with   Allergic Reaction    HPI Olivia Giles is a 41 y.o. female.  Patient presents complaining of generalized rash since Wednesday night (3-4 days).  Patient was seen at this clinic on 10/19 and diagnosed with a drug-related rash.  Patient was given a dexamethasone shot in office which patient states she had some relief.  Patient was sent home on prednisone and hydroxyzine which patient has stated that medications are not helping and actually may be making symptoms worse.  Patient is reporting pruritus.  Patient reports fever upon onset of symptoms which has now resolved.  Patient was diagnosed with cervical lymphadenopathy earlier this month on 10/06.  Patient was evaluated by another provider per Epic.  Patient was given Augmentin and clindamycin for this issue, and has finished a prescription.  Patient had referral placed to dentistry by internal medicine provider . 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 Authorizing Provider   cetirizine (ZYRTEC ALLERGY) 10 MG tablet Take 1 tablet (10 mg total) by mouth daily. 11/13/21  Yes Flossie Dibble, NP  famotidine (PEPCID) 20 MG tablet Take 1 tablet (20 mg total) by mouth 2 (two) times daily. 11/13/21  Yes Flossie Dibble, NP  albuterol (VENTOLIN HFA) 108 (90 Base) MCG/ACT inhaler Inhale 1-2 puffs into the lungs every 6 (six) hours as needed for wheezing or shortness of breath. 06/10/20   Hughie Closs, PA-C  hydrOXYzine (ATARAX) 50 MG tablet Take 1 tablet (50 mg total) by mouth every 6 (six) hours as needed. 11/11/21   Lamptey, Myrene Galas, MD  predniSONE (STERAPRED UNI-PAK 21 TAB) 10 MG (21) TBPK tablet Take by mouth daily. Take 6 tabs by mouth daily  for 2 days, then 5 tabs for 2 days, then 4 tabs for 2 days, then 3 tabs for 2 days, 2 tabs for 2 days, then 1 tab by mouth daily for 2 days 11/11/21   Chase Picket, MD  promethazine (PHENERGAN) 12.5 MG tablet Take 1 tablet (12.5 mg total) by mouth every 6 (six) hours as needed for nausea or vomiting. Patient not taking: No sig reported 08/16/16 05/15/20  Laury Deep, CNM    Family History  Family History  Problem Relation Age of Onset   COPD Mother    Hypertension Mother    Hypertension Father    Hypertension Sister     Social History Social History   Tobacco Use   Smoking status: Some Days    Packs/day: 0.25    Years: 2.00    Total pack years: 0.50    Types: Cigarettes   Smokeless tobacco: Never  Vaping Use   Vaping Use: Never used  Substance Use Topics   Alcohol use: No   Drug use: No     Allergies   Bee venom   Review of Systems Review of Systems  Constitutional:  Positive for chills (Upon onset of symptoms) and fever (Upon onset of symptoms). Negative for unexpected weight change.  Musculoskeletal:  Negative for arthralgias and myalgias.  Skin:  Positive for color change and rash.     Physical Exam Triage Vital Signs ED Triage Vitals  Enc Vitals Group     BP 11/13/21 1523  123/77     Pulse Rate 11/13/21 1523 88     Resp 11/13/21 1523 18     Temp 11/13/21 1523 99.5 F (37.5 C)     Temp src --      SpO2 11/13/21 1523 100 %     Weight --      Height --      Head Circumference --      Peak Flow --      Pain Score 11/13/21 1522 0     Pain Loc --      Pain Edu? --      Excl. in GC? --    No data found.  Updated Vital Signs BP 123/77   Pulse 88   Temp 99.5 F (37.5 C)   Resp 18   LMP 11/10/2021 (Approximate)   SpO2 100%     Physical Exam Vitals and nursing note reviewed.  Constitutional:      Appearance: Normal appearance.  Cardiovascular:     Rate and Rhythm: Normal rate and regular rhythm.     Heart sounds: Normal heart sounds, S1 normal and S2 normal.  Pulmonary:     Effort: Pulmonary effort is normal.     Breath sounds: Normal breath sounds and air entry. No stridor. No decreased breath sounds, wheezing, rhonchi or rales.  Skin:    General: Skin is warm.     Findings: Erythema and rash present. Rash is macular, papular and purpuric. Rash is not pustular.     Comments: Generalized raised maculopapular rash present on head, back, bilateral arms, chest, and neck.     Neurological:     Mental Status: She is alert.      UC Treatments / Results  Labs (all labs ordered are listed, but only abnormal results are displayed) Labs Reviewed  CBC WITH DIFFERENTIAL/PLATELET  COMPREHENSIVE METABOLIC PANEL  RPR  HIV ANTIBODY (ROUTINE TESTING W REFLEX)    EKG   Radiology No results found.  Procedures Procedures (including critical care time)  Medications Ordered in UC Medications  famotidine (PEPCID) tablet 20 mg (has no administration in time range)    Initial Impression / Assessment and Plan / UC Course  I have reviewed the triage vital signs and the nursing notes.  Pertinent labs & imaging results that were available during my care of the patient were reviewed by me and considered in my medical decision making (see chart for  details).     Patient was evaluated for  generalized maculopapular rash.  Patient had been seen in office on 10/19 started on glucocorticoid therapy hydroxyzine.  Due to patient's symptomology and worsening pruritus per patient statement.  This writer discontinued hydroxyzine, started on cetirizine and Pepcid for an alternate histamine medication coverage.  Patient was given a dose of Pepcid in office today.  Repeat CBC with differential and CMP was performed in office.  HIV and RPR testing performed.  She was made aware of results reporting protocol. Possible etiology related to positive Epstein-Barr virus and amoxicillin regimen or rash caused by Epstein-Barr virus, patient was counseled on this possibility.  Patient was made aware that symptom management would be used even if this was the case.  Patient was given information for dermatology and advised if symptoms continue to follow-up with dermatology.  Patient verbalized understanding of instructions.  Final Clinical Impressions(s) / UC Diagnoses   Final diagnoses:  Maculopapular rash, generalized   Discharge Instructions   None    ED Prescriptions     Medication Sig Dispense Auth. Provider   cetirizine (ZYRTEC ALLERGY) 10 MG tablet Take 1 tablet (10 mg total) by mouth daily. 30 tablet Debby Freiberg, NP   famotidine (PEPCID) 20 MG tablet Take 1 tablet (20 mg total) by mouth 2 (two) times daily. 30 tablet Debby Freiberg, NP      PDMP not reviewed this encounter.   Debby Freiberg, NP 11/13/21 1706

## 2021-11-14 ENCOUNTER — Telehealth (HOSPITAL_COMMUNITY): Payer: Self-pay | Admitting: Emergency Medicine

## 2021-11-14 ENCOUNTER — Encounter (HOSPITAL_BASED_OUTPATIENT_CLINIC_OR_DEPARTMENT_OTHER): Payer: Self-pay | Admitting: Emergency Medicine

## 2021-11-14 ENCOUNTER — Emergency Department (HOSPITAL_BASED_OUTPATIENT_CLINIC_OR_DEPARTMENT_OTHER)
Admission: EM | Admit: 2021-11-14 | Discharge: 2021-11-14 | Disposition: A | Discharge status: Home / Self Care | Payer: PRIVATE HEALTH INSURANCE | Attending: Emergency Medicine | Admitting: Emergency Medicine

## 2021-11-14 ENCOUNTER — Other Ambulatory Visit: Payer: Self-pay

## 2021-11-14 DIAGNOSIS — Z7952 Long term (current) use of systemic steroids: Secondary | ICD-10-CM | POA: Insufficient documentation

## 2021-11-14 DIAGNOSIS — Z7951 Long term (current) use of inhaled steroids: Secondary | ICD-10-CM | POA: Diagnosis not present

## 2021-11-14 DIAGNOSIS — J45909 Unspecified asthma, uncomplicated: Secondary | ICD-10-CM | POA: Insufficient documentation

## 2021-11-14 DIAGNOSIS — T887XXA Unspecified adverse effect of drug or medicament, initial encounter: Secondary | ICD-10-CM | POA: Insufficient documentation

## 2021-11-14 DIAGNOSIS — T7840XA Allergy, unspecified, initial encounter: Secondary | ICD-10-CM

## 2021-11-14 DIAGNOSIS — L500 Allergic urticaria: Secondary | ICD-10-CM | POA: Diagnosis not present

## 2021-11-14 LAB — CBC
HCT: 36.4 % (ref 36.0–46.0)
Hemoglobin: 11.9 g/dL — ABNORMAL LOW (ref 12.0–15.0)
MCH: 29.2 pg (ref 26.0–34.0)
MCHC: 32.7 g/dL (ref 30.0–36.0)
MCV: 89.4 fL (ref 80.0–100.0)
Platelets: 280 10*3/uL (ref 150–400)
RBC: 4.07 MIL/uL (ref 3.87–5.11)
RDW: 13.8 % (ref 11.5–15.5)
WBC: 4.9 10*3/uL (ref 4.0–10.5)
nRBC: 0 % (ref 0.0–0.2)

## 2021-11-14 LAB — BASIC METABOLIC PANEL
Anion gap: 9 (ref 5–15)
BUN: 14 mg/dL (ref 6–20)
CO2: 27 mmol/L (ref 22–32)
Calcium: 9 mg/dL (ref 8.9–10.3)
Chloride: 105 mmol/L (ref 98–111)
Creatinine, Ser: 0.72 mg/dL (ref 0.44–1.00)
GFR, Estimated: >60 mL/min (ref >60)
Glucose, Bld: 88 mg/dL (ref 70–99)
Potassium: 3.6 mmol/L (ref 3.5–5.1)
Sodium: 141 mmol/L (ref 135–145)

## 2021-11-14 LAB — RPR: RPR Ser Ql: NONREACTIVE

## 2021-11-14 MED ORDER — EPINEPHRINE 0.3 MG/0.3ML IJ SOAJ: 0.3000 mg | INTRAMUSCULAR | 0 refills | Status: AC | PRN

## 2021-11-14 MED ORDER — CETIRIZINE HCL 5 MG/5ML PO SOLN
10.0000 mg | Freq: Once | ORAL | Status: AC
  Administered 2021-11-14: 10 mg via ORAL
  Filled 2021-11-14: qty 10

## 2021-11-14 MED ORDER — METHYLPREDNISOLONE SODIUM SUCC 125 MG IJ SOLR
125.0000 mg | Freq: Once | INTRAMUSCULAR | Status: AC
  Administered 2021-11-14: 125 mg via INTRAVENOUS
  Filled 2021-11-14: qty 2

## 2021-11-14 NOTE — ED Provider Notes (Signed)
[facility] EM[facility]GENC[facility] DEPT Provider Note   CSN: [id number] Arrival date & time: 11/14/21  1048     History  Chief Complaint  Patient presents with   Allergic Reaction    Olivia Giles is a 41 y.o. female.   Allergic Reaction Patient presents with potential allergic reaction.  Began around on Thursday.  Had been on initially amoxicillin and then on clindamycin for swollen lymph nodes on the left side of her neck.  With the clindamycin finishing up developed more of a diffuse rash.  However also found to have positive mono testing previously.  Diffuse itching.  Had been given some steroids IV and then was feeling somewhat better.  Then began to feel worse again.  Now diffuse rash including involving face.  It is itchy all over.  Has been taking some Zyrtec without relief.  No difficulty swallowing.  No difficulty breathing.  States the neck swelling is improved.  Reviewing notes it appears as if that has been there for months.    Past Medical History:  Diagnosis Date   Asthma    Bronchitis     Home Medications Prior to Admission medications   Medication Sig Start Date End Date Taking? Authorizing Provider  EPINEPHrine 0.3 mg/0.3 mL IJ SOAJ injection Inject 0.3 mg into the muscle as needed for anaphylaxis. 11/14/21  Yes Davonna Belling, MD  albuterol (VENTOLIN HFA) 108 (90 Base) MCG/ACT inhaler Inhale 1-2 puffs into the lungs every 6 (six) hours as needed for wheezing or shortness of breath. 06/10/20   Hughie Closs, PA-C  cetirizine (ZYRTEC ALLERGY) 10 MG tablet Take 1 tablet (10 mg total) by mouth daily. 11/13/21   Flossie Dibble, NP  famotidine (PEPCID) 20 MG tablet Take 1 tablet (20 mg total) by mouth 2 (two) times daily. 11/13/21   Flossie Dibble, NP  predniSONE (STERAPRED UNI-PAK 21 TAB) 10 MG (21) TBPK tablet Take by mouth daily. Take 6 tabs by mouth daily  for 2 days, then 5 tabs for 2 days, then 4 tabs for 2 days, then 3 tabs for 2 days, 2 tabs  for 2 days, then 1 tab by mouth daily for 2 days 11/11/21   Chase Picket, MD  promethazine (PHENERGAN) 12.5 MG tablet Take 1 tablet (12.5 mg total) by mouth every 6 (six) hours as needed for nausea or vomiting. Patient not taking: No sig reported 08/16/16 05/15/20  Laury Deep, CNM      Allergies    Bee venom    Review of Systems   Review of Systems  Physical Exam Updated Vital Signs BP 115/81   Pulse 81   Temp (!) 89.9 F (32.2 C) (Oral)   Resp 16   Ht 5' (1.524 m)   Wt 88.9 kg   LMP 11/10/2021 (Approximate)   SpO2 100%   BMI 38.28 kg/m  Physical Exam Vitals and nursing note reviewed.  HENT:     Head: Atraumatic.  Cardiovascular:     Rate and Rhythm: Regular rhythm.  Pulmonary:     Breath sounds: No wheezing.  Skin:    Capillary Refill: Capillary refill takes less than 2 seconds.     Comments: Diffuse hives/redness over much of body.  Also involves some face with some mild periorbital swelling.  No posterior pharyngeal or intraoral swelling.  Neurological:     Mental Status: She is alert and oriented to person, place, and time.     ED Results / Procedures / Treatments   Labs (  all labs ordered are listed, but only abnormal results are displayed) Labs Reviewed  CBC - Abnormal; Notable for the following components:      Result Value   Hemoglobin 11.9 (*)    All other components within normal limits  BASIC METABOLIC PANEL    EKG None  Radiology No results found.  Procedures Procedures    Medications Ordered in ED Medications  methylPREDNISolone sodium succinate (SOLU-MEDROL) 125 mg/2 mL injection 125 mg (125 mg Intravenous Given 11/14/21 1153)  cetirizine HCl (Zyrtec) 5 MG/5ML solution 10 mg (10 mg Oral Given 11/14/21 1139)    ED Course/ Medical Decision Making/ A&P                           Medical Decision Making Amount and/or Complexity of Data Reviewed Labs: ordered.  Risk Prescription drug management.   Patient with diffuse rash.   Began after taking antibiotics.  Potentially could be drug rash or also could be secondary to mono and amoxicillin.  Does have asthma history.  Not wheezing at this time.  No mild swelling.  Given steroids here and some Zyrtec.  Will start on steroids.  Reviewed notes however and she had been prescribed steroids from urgent care.  States she had not started taking them but did fill them.  She will start taking them.  Also given an EpiPen since has had worsening rash.  Needs to follow-up with PCP or potentially allergist.        Final Clinical Impression(s) / ED Diagnoses Final diagnoses:  Allergic reaction to drug, initial encounter    Rx / DC Orders ED Discharge Orders          Ordered    EPINEPHrine 0.3 mg/0.3 mL IJ SOAJ injection  As needed        11/14/21 1438              Benjiman Core, MD 11/14/21 1533

## 2021-11-14 NOTE — ED Notes (Signed)
Patient verbalizes understanding of discharge instructions. Opportunity for questioning and answers were provided. Patient discharged from ED.  °

## 2021-11-14 NOTE — Telephone Encounter (Signed)
This Probation officer contacted patient, patient identity was verified.  This Probation officer spoke to patient about her test results.  This Probation officer spoke to the patient about the etiology of her symptoms may be based on positive Epstein-Barr virus testing especially due to the initial management of her symptoms earlier this month due to unknown etiology of cervical lymphadenopathy diagnosis in early October.  AST/ALT ratio is less than 1, this can suggest elevation is due to infectious mononucleosis.  Patient stated her generalized rash has worsened after glucocorticoid therapy and several antihistamines the rash has not improved.  Patient states that the rash has began affecting her eyes and is now affecting her vision.  This Probation officer advised patient to go to emergency department for further evaluation.  Patient verbalized understanding of instructions and stated that she is having a friend come pick her up to go to the hospital.

## 2021-11-14 NOTE — Discharge Instructions (Addendum)
Take the prednisone prescription you were previously prescribed.  Follow-up with an allergist.

## 2021-11-14 NOTE — ED Triage Notes (Signed)
Pt via pov from home with allergic reaction; states it began on Thursday after a course of clindamycin that she completed on Tuesday. Pt denies any other medication allergies; states she only has an allergy to bee stings. Pt alert & oriented, face is swollen; eyes almost closed. Denies any difficulty breathing.

## 2021-11-15 ENCOUNTER — Ambulatory Visit: Payer: Self-pay | Admitting: *Deleted

## 2021-11-15 NOTE — Telephone Encounter (Signed)
  Chief Complaint: rash all over Symptoms: went to ED and UC Frequency: wants rx strength cortisone cream Pertinent Negatives: Patient denies fever was + mono Disposition: [] ED /[] Urgent Care (no appt availability in office) / [] Appointment(In office/virtual)/ []  [facility]/ [] Home Care/ [] Refused Recommended Disposition /[] [facility]/ [x]  Follow-up with PCP Additional Notes: Pt already been to UC and ED, told to get epi pen it was $200, can you help find a way to get one. Also pt wants rx strength cortisone cream and referral to allergiest per ED MD.    Reason for Disposition  Heat rash, questions about  Answer Assessment - Initial Assessment Questions 1. APPEARANCE of RASH: "Describe the rash." (e.g., spots, blisters, raised areas, skin, scaly)     Red rash, spots started wed and has spread chest and back and neck 2. SIZE: "How big are the spots?" (e.g., tip of pen, eraser, coin; inches, centimeters)     Solid area 3. LOCATION: "Where is the rash located?"     All over 4. COLOR: "What color is the rash?" (Note: It is difficult to assess rash color in people with darker-colored skin. When this situation occurs, simply ask the caller to describe what they see.)     red 5. ONSET: "When did the rash begin?"     5 days 6. FEVER: "Do you have a fever?" If Yes, ask: "What is your temperature, how was it measured, and when did it start?"     Not now 7. ITCHING: "Does the rash itch?" If Yes, ask: "How bad is the itch?" (Scale 1-10; or mild, moderate, severe)     yes 8. CAUSE: "What do you think is causing the rash?"     Started after Amoxicillin 9. MEDICINE FACTORS: "Have you started any new medicines within the last 2 weeks?" (e.g., antibiotics)      Amoxicillin 10. OTHER SYMPTOMS: "Do you have any other symptoms?" (e.g., dizziness, headache, sore throat, joint pain)       no 11. PREGNANCY: "Is there any chance you are pregnant?" "When was your last menstrual  period?"       no  Protocols used: Rash or Redness - [facility]

## 2021-11-26 NOTE — Telephone Encounter (Signed)
Called patient and she stated that her rash has gone away and is taking the allergy pill to help prevent another break out and she does have the epi pen as well. Has 2 up coming appointment and will keep those.

## 2021-12-03 MED ORDER — CEPHALEXIN 500 MG PO CAPS: 500.0000 mg | ORAL_CAPSULE | Freq: Two times a day (BID) | ORAL | 0 refills | Status: AC

## 2021-12-03 NOTE — Telephone Encounter (Signed)

## 2021-12-21 ENCOUNTER — Ambulatory Visit: Payer: Self-pay | Admitting: Family Medicine

## 2022-01-05 ENCOUNTER — Ambulatory Visit: Payer: PRIVATE HEALTH INSURANCE | Admitting: Physician Assistant

## 2022-02-08 ENCOUNTER — Ambulatory Visit: Payer: Self-pay | Admitting: Critical Care Medicine

## 2022-09-17 ENCOUNTER — Other Ambulatory Visit: Payer: Self-pay

## 2022-09-17 ENCOUNTER — Emergency Department (HOSPITAL_COMMUNITY)
Admission: EM | Admit: 2022-09-17 | Discharge: 2022-09-17 | Disposition: A | Discharge status: Home / Self Care | Payer: BC Managed Care – PPO | Attending: Emergency Medicine | Admitting: Emergency Medicine

## 2022-09-17 ENCOUNTER — Emergency Department (HOSPITAL_COMMUNITY): Payer: BC Managed Care – PPO

## 2022-09-17 ENCOUNTER — Encounter (HOSPITAL_COMMUNITY): Payer: Self-pay

## 2022-09-17 DIAGNOSIS — O209 Hemorrhage in early pregnancy, unspecified: Secondary | ICD-10-CM | POA: Diagnosis present

## 2022-09-17 DIAGNOSIS — O200 Threatened abortion: Secondary | ICD-10-CM | POA: Insufficient documentation

## 2022-09-17 DIAGNOSIS — Z3A01 Less than 8 weeks gestation of pregnancy: Secondary | ICD-10-CM | POA: Insufficient documentation

## 2022-09-17 LAB — COMPREHENSIVE METABOLIC PANEL
ALT: 15 U/L (ref 0–44)
AST: 14 U/L — ABNORMAL LOW (ref 15–41)
Albumin: 3.2 g/dL — ABNORMAL LOW (ref 3.5–5.0)
Alkaline Phosphatase: 42 U/L (ref 38–126)
Anion gap: 5 (ref 5–15)
BUN: 11 mg/dL (ref 6–20)
CO2: 22 mmol/L (ref 22–32)
Calcium: 8.5 mg/dL — ABNORMAL LOW (ref 8.9–10.3)
Chloride: 105 mmol/L (ref 98–111)
Creatinine, Ser: 0.63 mg/dL (ref 0.44–1.00)
GFR, Estimated: >60 mL/min (ref >60)
Glucose, Bld: 98 mg/dL (ref 70–99)
Potassium: 3.8 mmol/L (ref 3.5–5.1)
Sodium: 132 mmol/L — ABNORMAL LOW (ref 135–145)
Total Bilirubin: 0.4 mg/dL (ref 0.3–1.2)
Total Protein: 7.6 g/dL (ref 6.5–8.1)

## 2022-09-17 LAB — CBC WITH DIFFERENTIAL/PLATELET
Abs Immature Granulocytes: 0.05 10*3/uL (ref 0.00–0.07)
Basophils Absolute: 0 10*3/uL (ref 0.0–0.1)
Basophils Relative: 0 %
Eosinophils Absolute: 0 10*3/uL (ref 0.0–0.5)
Eosinophils Relative: 1 %
HCT: 32.1 % — ABNORMAL LOW (ref 36.0–46.0)
Hemoglobin: 10.7 g/dL — ABNORMAL LOW (ref 12.0–15.0)
Immature Granulocytes: 1 %
Lymphocytes Relative: 35 %
Lymphs Abs: 1.9 10*3/uL (ref 0.7–4.0)
MCH: 29.4 pg (ref 26.0–34.0)
MCHC: 33.3 g/dL (ref 30.0–36.0)
MCV: 88.2 fL (ref 80.0–100.0)
Monocytes Absolute: 0.4 10*3/uL (ref 0.1–1.0)
Monocytes Relative: 8 %
Neutro Abs: 3.1 10*3/uL (ref 1.7–7.7)
Neutrophils Relative %: 55 %
Platelets: 278 10*3/uL (ref 150–400)
RBC: 3.64 MIL/uL — ABNORMAL LOW (ref 3.87–5.11)
RDW: 13.6 % (ref 11.5–15.5)
WBC: 5.6 10*3/uL (ref 4.0–10.5)
nRBC: 0 % (ref 0.0–0.2)

## 2022-09-17 LAB — HCG, QUANTITATIVE, PREGNANCY: hCG, Beta Chain, Quant, S: 67281 m[IU]/mL — ABNORMAL HIGH (ref <5)

## 2022-09-17 LAB — ABO/RH: ABO/RH(D): O POS

## 2022-09-17 MED ORDER — ONDANSETRON 4 MG PO TBDP
4.0000 mg | ORAL_TABLET | Freq: Once | ORAL | Status: AC
  Administered 2022-09-17: 4 mg via ORAL
  Filled 2022-09-17: qty 1

## 2022-09-17 NOTE — ED Triage Notes (Signed)
Pt states she is [redacted] weeks pregnant and states she was bleeding moderate amount this morning. Pt states she was seen at [facility] Wednesday for bleeding as well. Pt state [facility] did blood work and verified pregnancy.

## 2022-09-17 NOTE — ED Provider Notes (Signed)
[facility] EMERGENCY DEPARTMENT AT [facility] Provider Note   CSN: [id number] Arrival date & time: 09/17/22  8315     History  Chief Complaint  Patient presents with   Vaginal Bleeding    Pt [redacted] weeks pregnant     Olivia Giles is a 42 y.o. female.  Patient has a history of early intrauterine pregnancy.  She complains of some vaginal bleeding and mild abdominal discomfort  The history is provided by the patient and medical records. No language interpreter was used.  Vaginal Bleeding Quality:  Bright red Severity:  Mild Onset quality:  Sudden Timing:  Constant Progression:  Waxing and waning Chronicity:  New Possible pregnancy: no   Relieved by:  Nothing Worsened by:  Nothing Associated symptoms: no abdominal pain, no back pain and no fatigue        Home Medications Prior to Admission medications   Medication Sig Start Date End Date Taking? Authorizing Provider  albuterol (VENTOLIN HFA) 108 (90 Base) MCG/ACT inhaler Inhale 1-2 puffs into the lungs every 6 (six) hours as needed for wheezing or shortness of breath. 06/10/20   Rushie Chestnut, PA-C  cephALEXin (KEFLEX) 500 MG capsule Take 1 capsule (500 mg total) by mouth 2 (two) times daily. 12/03/21   Hoy Register, MD  cetirizine (ZYRTEC ALLERGY) 10 MG tablet Take 1 tablet (10 mg total) by mouth daily. 11/13/21   Debby Freiberg, NP  EPINEPHrine 0.3 mg/0.3 mL IJ SOAJ injection Inject 0.3 mg into the muscle as needed for anaphylaxis. 11/14/21   Benjiman Core, MD  famotidine (PEPCID) 20 MG tablet Take 1 tablet (20 mg total) by mouth 2 (two) times daily. 11/13/21   Debby Freiberg, NP  predniSONE (STERAPRED UNI-PAK 21 TAB) 10 MG (21) TBPK tablet Take by mouth daily. Take 6 tabs by mouth daily  for 2 days, then 5 tabs for 2 days, then 4 tabs for 2 days, then 3 tabs for 2 days, 2 tabs for 2 days, then 1 tab by mouth daily for 2 days 11/11/21   Merrilee Jansky, MD  promethazine (PHENERGAN) 12.5 MG  tablet Take 1 tablet (12.5 mg total) by mouth every 6 (six) hours as needed for nausea or vomiting. Patient not taking: No sig reported 08/16/16 05/15/20  Raelyn Mora, CNM      Allergies    Bee venom    Review of Systems   Review of Systems  Constitutional:  Negative for appetite change and fatigue.  HENT:  Negative for congestion, ear discharge and sinus pressure.   Eyes:  Negative for discharge.  Respiratory:  Negative for cough.   Cardiovascular:  Negative for chest pain.  Gastrointestinal:  Negative for abdominal pain and diarrhea.  Genitourinary:  Positive for vaginal bleeding. Negative for frequency and hematuria.  Musculoskeletal:  Negative for back pain.  Skin:  Negative for rash.  Neurological:  Negative for seizures and headaches.  Psychiatric/Behavioral:  Negative for hallucinations.     Physical Exam Updated Vital Signs BP 114/69 (BP Location: Right Arm)   Pulse 89   Temp 98.5 F (36.9 C) (Oral)   Resp 16   Ht 5' (1.524 m)   Wt 88.9 kg   LMP 07/18/2022 (Exact Date)   SpO2 100%   BMI 38.28 kg/m  Physical Exam Vitals and nursing note reviewed.  Constitutional:      Appearance: She is well-developed.  HENT:     Head: Normocephalic.     Nose: Nose normal.  Eyes:  General: No scleral icterus.    Conjunctiva/sclera: Conjunctivae normal.  Neck:     Thyroid: No thyromegaly.  Cardiovascular:     Rate and Rhythm: Normal rate and regular rhythm.     Heart sounds: No murmur heard.    No friction rub. No gallop.  Pulmonary:     Breath sounds: No stridor. No wheezing or rales.  Chest:     Chest wall: No tenderness.  Abdominal:     General: There is no distension.     Tenderness: There is no abdominal tenderness. There is no rebound.  Musculoskeletal:        General: Normal range of motion.     Cervical back: Neck supple.  Lymphadenopathy:     Cervical: No cervical adenopathy.  Skin:    Findings: No erythema or rash.  Neurological:     Mental  Status: She is alert and oriented to person, place, and time.     Motor: No abnormal muscle tone.     Coordination: Coordination normal.  Psychiatric:        Behavior: Behavior normal.     ED Results / Procedures / Treatments   Labs (all labs ordered are listed, but only abnormal results are displayed) Labs Reviewed  CBC WITH DIFFERENTIAL/PLATELET - Abnormal; Notable for the following components:      Result Value   RBC 3.64 (*)    Hemoglobin 10.7 (*)    HCT 32.1 (*)    All other components within normal limits  COMPREHENSIVE METABOLIC PANEL - Abnormal; Notable for the following components:   Sodium 132 (*)    Calcium 8.5 (*)    Albumin 3.2 (*)    AST 14 (*)    All other components within normal limits  HCG, QUANTITATIVE, PREGNANCY - Abnormal; Notable for the following components:   hCG, Beta Chain, Quant, S 40,981 (*)    All other components within normal limits  ABO/RH    EKG None  Radiology US OB LESS THAN 14 WEEKS WITH OB TRANSVAGINAL  Result Date: 09/17/2022 CLINICAL DATA:  Vaginal bleeding in 1st trimester pregnancy. EXAM: OBSTETRIC <14 WK [place] AND TRANSVAGINAL OB US TECHNIQUE: Both transabdominal and transvaginal ultrasound examinations were performed for complete evaluation of the gestation as well as the maternal uterus, adnexal regions, and pelvic cul-de-sac. Transvaginal technique was performed to assess early pregnancy. COMPARISON:  None Available. FINDINGS: Intrauterine gestational sac: Single; sac shape is irregular Yolk sac:  Present but abnormal appearing Embryo:  Visualized. Cardiac Activity: Not Visualized. CRL:  14 mm   7 w   5 d                  [place] EDC: 05/01/2023 Subchorionic hemorrhage:  None visualized. Maternal uterus/adnexae: Several small uterine fibroids are noted, largest measuring 2.3 cm. Both ovaries are unremarkable in appearance. No adnexal mass or abnormal free fluid identified. IMPRESSION: Findings meet definitive criteria for failed pregnancy. This  follows SRU consensus guidelines: Diagnostic Criteria for Nonviable Pregnancy Early in the First Trimester. Macy Mis J Med [id number]. Several small uterine fibroids. Electronically Signed   By: Danae Orleans M.D.   On: 09/17/2022 12:15    Procedures Procedures    Medications Ordered in ED Medications  ondansetron (ZOFRAN-ODT) disintegrating tablet 4 mg (4 mg Oral Given 09/17/22 1210)    ED Course/ Medical Decision Making/ A&P  Medical Decision Making Amount and/or Complexity of Data Reviewed Labs: ordered. Radiology: ordered.  Risk Prescription drug management.   Ultrasound shows a failed pregnancy that is 7 weeks and 5 days.  I spoke to OB/GYN and the patient will follow-up with her doctor the beginning of the week.  She is instructed if she has a lot of pain or bleeding that she should return to either [facility] or even the hospital because that is her OB/GYN S        Final Clinical Impression(s) / ED Diagnoses Final diagnoses:  Threatened miscarriage    Rx / DC Orders ED Discharge Orders     None         Bethann Berkshire, MD 09/18/22 0930

## 2022-09-17 NOTE — Discharge Instructions (Signed)
Follow-up with your doctor this week.  Get seen sooner if you have heavy bleeding or heavy pain.
